# Patient Record
Sex: Male | Born: 1955 | Race: White | Hispanic: No | Marital: Married | Smoking: Former smoker
Health system: Southern US, Community
[De-identification: ages and names within clinical notes are randomized; demographics above are authoritative.]

## PROBLEM LIST (undated history)

## (undated) DIAGNOSIS — T7840XA Allergy, unspecified, initial encounter: Secondary | ICD-10-CM

## (undated) DIAGNOSIS — I4510 Unspecified right bundle-branch block: Secondary | ICD-10-CM

## (undated) DIAGNOSIS — S8252XD Displaced fracture of medial malleolus of left tibia, subsequent encounter for closed fracture with routine healing: Secondary | ICD-10-CM

## (undated) DIAGNOSIS — R7611 Nonspecific reaction to tuberculin skin test without active tuberculosis: Secondary | ICD-10-CM

## (undated) HISTORY — DX: Unspecified right bundle-branch block: I45.10

## (undated) HISTORY — PX: VASECTOMY: SHX75

## (undated) HISTORY — PX: TONSILLECTOMY: SUR1361

## (undated) HISTORY — DX: Nonspecific reaction to tuberculin skin test without active tuberculosis: R76.11

## (undated) HISTORY — DX: Allergy, unspecified, initial encounter: T78.40XA

## (undated) HISTORY — PX: LIPOMA EXCISION: SHX5283

## (undated) HISTORY — PX: COLON SURGERY: SHX602

---

## 2002-01-30 ENCOUNTER — Encounter: Payer: Self-pay | Admitting: Infectious Diseases

## 2002-01-30 ENCOUNTER — Ambulatory Visit (HOSPITAL_COMMUNITY): Admission: RE | Admit: 2002-01-30 | Discharge: 2002-01-30 | Payer: Self-pay | Admitting: Infectious Diseases

## 2004-05-26 ENCOUNTER — Ambulatory Visit: Payer: Self-pay | Admitting: Family Medicine

## 2004-06-21 ENCOUNTER — Emergency Department (HOSPITAL_COMMUNITY): Admission: EM | Admit: 2004-06-21 | Discharge: 2004-06-21 | Payer: Self-pay | Admitting: Family Medicine

## 2005-07-12 ENCOUNTER — Ambulatory Visit: Payer: Self-pay | Admitting: Internal Medicine

## 2006-09-04 ENCOUNTER — Ambulatory Visit: Payer: Self-pay | Admitting: Internal Medicine

## 2006-09-11 LAB — CONVERTED CEMR LAB
ALT: 16 units/L (ref 0–53)
AST: 19 units/L (ref 0–37)
Albumin: 4 g/dL (ref 3.5–5.2)
Alkaline Phosphatase: 67 units/L (ref 39–117)
BUN: 16 mg/dL (ref 6–23)
Basophils Absolute: 0 10*3/uL (ref 0.0–0.1)
Basophils Relative: 0.5 % (ref 0.0–1.0)
Bilirubin, Direct: 0.1 mg/dL (ref 0.0–0.3)
CO2: 29 meq/L (ref 19–32)
Calcium: 9.4 mg/dL (ref 8.4–10.5)
Chloride: 111 meq/L (ref 96–112)
Cholesterol: 169 mg/dL (ref 0–200)
Creatinine, Ser: 0.9 mg/dL (ref 0.4–1.5)
Eosinophils Absolute: 0.1 10*3/uL (ref 0.0–0.6)
Eosinophils Relative: 2.9 % (ref 0.0–5.0)
GFR calc Af Amer: 115 mL/min
GFR calc non Af Amer: 95 mL/min
Glucose, Bld: 85 mg/dL (ref 70–99)
HCT: 45.7 % (ref 39.0–52.0)
HDL: 42.9 mg/dL (ref >39.0)
Hemoglobin: 15.1 g/dL (ref 13.0–17.0)
LDL Cholesterol: 120 mg/dL — ABNORMAL HIGH (ref 0–99)
Lymphocytes Relative: 27 % (ref 12.0–46.0)
MCHC: 33 g/dL (ref 30.0–36.0)
MCV: 87.1 fL (ref 78.0–100.0)
Monocytes Absolute: 0.5 10*3/uL (ref 0.2–0.7)
Monocytes Relative: 11.5 % — ABNORMAL HIGH (ref 3.0–11.0)
Neutro Abs: 2.8 10*3/uL (ref 1.4–7.7)
Neutrophils Relative %: 58.1 % (ref 43.0–77.0)
PSA: 1.01 ng/mL (ref 0.10–4.00)
Platelets: 188 10*3/uL (ref 150–400)
Potassium: 4.4 meq/L (ref 3.5–5.1)
RBC: 5.24 M/uL (ref 4.22–5.81)
RDW: 13.4 % (ref 11.5–14.6)
Sodium: 144 meq/L (ref 135–145)
Total Bilirubin: 0.7 mg/dL (ref 0.3–1.2)
Total CHOL/HDL Ratio: 3.9
Total Protein: 6.6 g/dL (ref 6.0–8.3)
Triglycerides: 30 mg/dL (ref 0–149)
VLDL: 6 mg/dL (ref 0–40)
WBC: 4.7 10*3/uL (ref 4.5–10.5)

## 2007-09-13 ENCOUNTER — Ambulatory Visit: Payer: Self-pay | Admitting: Internal Medicine

## 2007-10-15 ENCOUNTER — Emergency Department (HOSPITAL_BASED_OUTPATIENT_CLINIC_OR_DEPARTMENT_OTHER): Admission: EM | Admit: 2007-10-15 | Discharge: 2007-10-15 | Payer: Self-pay | Admitting: Emergency Medicine

## 2007-11-20 ENCOUNTER — Encounter (INDEPENDENT_AMBULATORY_CARE_PROVIDER_SITE_OTHER): Payer: Self-pay | Admitting: *Deleted

## 2007-11-20 ENCOUNTER — Ambulatory Visit: Payer: Self-pay | Admitting: Internal Medicine

## 2007-11-20 LAB — CONVERTED CEMR LAB
OCCULT 1: NEGATIVE
OCCULT 2: NEGATIVE
OCCULT 3: NEGATIVE

## 2008-08-26 ENCOUNTER — Emergency Department (HOSPITAL_COMMUNITY): Admission: EM | Admit: 2008-08-26 | Discharge: 2008-08-26 | Payer: Self-pay | Admitting: Family Medicine

## 2008-10-08 ENCOUNTER — Ambulatory Visit: Payer: Self-pay | Admitting: Internal Medicine

## 2008-10-10 LAB — CONVERTED CEMR LAB
ALT: 15 units/L (ref 0–53)
AST: 15 units/L (ref 0–37)
BUN: 16 mg/dL (ref 6–23)
Basophils Absolute: 0 10*3/uL (ref 0.0–0.1)
Basophils Relative: 0.7 % (ref 0.0–3.0)
CO2: 31 meq/L (ref 19–32)
Calcium: 9.2 mg/dL (ref 8.4–10.5)
Chloride: 106 meq/L (ref 96–112)
Cholesterol: 162 mg/dL (ref 0–200)
Creatinine, Ser: 1 mg/dL (ref 0.4–1.5)
Eosinophils Absolute: 0.1 10*3/uL (ref 0.0–0.7)
Eosinophils Relative: 3.2 % (ref 0.0–5.0)
GFR calc non Af Amer: 83.64 mL/min (ref >60)
Glucose, Bld: 92 mg/dL (ref 70–99)
HCT: 41.9 % (ref 39.0–52.0)
HDL: 39.1 mg/dL (ref >39.00)
Hemoglobin: 14.5 g/dL (ref 13.0–17.0)
LDL Cholesterol: 115 mg/dL — ABNORMAL HIGH (ref 0–99)
Lymphocytes Relative: 24.1 % (ref 12.0–46.0)
Lymphs Abs: 1.1 10*3/uL (ref 0.7–4.0)
MCHC: 34.6 g/dL (ref 30.0–36.0)
MCV: 87 fL (ref 78.0–100.0)
Monocytes Absolute: 0.5 10*3/uL (ref 0.1–1.0)
Monocytes Relative: 11.3 % (ref 3.0–12.0)
Neutro Abs: 2.8 10*3/uL (ref 1.4–7.7)
Neutrophils Relative %: 60.7 % (ref 43.0–77.0)
PSA: 0.75 ng/mL (ref 0.10–4.00)
Platelets: 177 10*3/uL (ref 150.0–400.0)
Potassium: 4.2 meq/L (ref 3.5–5.1)
RBC: 4.81 M/uL (ref 4.22–5.81)
RDW: 13.1 % (ref 11.5–14.6)
Sodium: 143 meq/L (ref 135–145)
Total CHOL/HDL Ratio: 4
Triglycerides: 42 mg/dL (ref 0.0–149.0)
VLDL: 8.4 mg/dL (ref 0.0–40.0)
WBC: 4.5 10*3/uL (ref 4.5–10.5)

## 2008-10-13 ENCOUNTER — Ambulatory Visit: Payer: Self-pay | Admitting: Internal Medicine

## 2008-10-13 ENCOUNTER — Encounter (INDEPENDENT_AMBULATORY_CARE_PROVIDER_SITE_OTHER): Payer: Self-pay | Admitting: *Deleted

## 2008-10-16 LAB — CONVERTED CEMR LAB: Fecal Occult Bld: NEGATIVE

## 2009-05-12 ENCOUNTER — Ambulatory Visit: Payer: Self-pay | Admitting: Internal Medicine

## 2009-05-12 DIAGNOSIS — M549 Dorsalgia, unspecified: Secondary | ICD-10-CM | POA: Insufficient documentation

## 2009-05-13 ENCOUNTER — Emergency Department (HOSPITAL_BASED_OUTPATIENT_CLINIC_OR_DEPARTMENT_OTHER): Admission: EM | Admit: 2009-05-13 | Discharge: 2009-05-13 | Payer: Self-pay | Admitting: Emergency Medicine

## 2009-05-15 ENCOUNTER — Ambulatory Visit: Payer: Self-pay | Admitting: Internal Medicine

## 2009-05-26 ENCOUNTER — Ambulatory Visit (HOSPITAL_COMMUNITY): Admission: RE | Admit: 2009-05-26 | Discharge: 2009-05-26 | Payer: Self-pay | Admitting: Specialist

## 2009-05-27 ENCOUNTER — Ambulatory Visit: Payer: Self-pay | Admitting: Internal Medicine

## 2009-05-27 LAB — CONVERTED CEMR LAB
Bilirubin Urine: NEGATIVE
Blood in Urine, dipstick: NEGATIVE
Glucose, Urine, Semiquant: NEGATIVE
Ketones, urine, test strip: NEGATIVE
Nitrite: NEGATIVE
Specific Gravity, Urine: 1.015
Urobilinogen, UA: 0.2
WBC Urine, dipstick: NEGATIVE
pH: 6

## 2009-11-12 ENCOUNTER — Ambulatory Visit: Payer: Self-pay | Admitting: Internal Medicine

## 2009-11-13 ENCOUNTER — Encounter (INDEPENDENT_AMBULATORY_CARE_PROVIDER_SITE_OTHER): Payer: Self-pay | Admitting: *Deleted

## 2009-11-13 LAB — CONVERTED CEMR LAB
ALT: 19 units/L (ref 0–53)
AST: 19 units/L (ref 0–37)
BUN: 17 mg/dL (ref 6–23)
Basophils Absolute: 0 10*3/uL (ref 0.0–0.1)
Basophils Relative: 0.6 % (ref 0.0–3.0)
CO2: 28 meq/L (ref 19–32)
Calcium: 9.2 mg/dL (ref 8.4–10.5)
Chloride: 104 meq/L (ref 96–112)
Cholesterol: 173 mg/dL (ref 0–200)
Creatinine, Ser: 1 mg/dL (ref 0.4–1.5)
Eosinophils Absolute: 0.2 10*3/uL (ref 0.0–0.7)
Eosinophils Relative: 2.5 % (ref 0.0–5.0)
GFR calc non Af Amer: 87.3 mL/min (ref >60)
Glucose, Bld: 85 mg/dL (ref 70–99)
HCT: 41.6 % (ref 39.0–52.0)
HDL: 40.6 mg/dL (ref >39.00)
Hemoglobin: 14.4 g/dL (ref 13.0–17.0)
LDL Cholesterol: 126 mg/dL — ABNORMAL HIGH (ref 0–99)
Lymphocytes Relative: 21.1 % (ref 12.0–46.0)
Lymphs Abs: 1.4 10*3/uL (ref 0.7–4.0)
MCHC: 34.5 g/dL (ref 30.0–36.0)
MCV: 88.7 fL (ref 78.0–100.0)
Monocytes Absolute: 0.7 10*3/uL (ref 0.1–1.0)
Monocytes Relative: 10.6 % (ref 3.0–12.0)
Neutro Abs: 4.2 10*3/uL (ref 1.4–7.7)
Neutrophils Relative %: 65.2 % (ref 43.0–77.0)
PSA: 0.76 ng/mL (ref 0.10–4.00)
Platelets: 187 10*3/uL (ref 150.0–400.0)
Potassium: 4.2 meq/L (ref 3.5–5.1)
RBC: 4.69 M/uL (ref 4.22–5.81)
RDW: 14.5 % (ref 11.5–14.6)
Sodium: 141 meq/L (ref 135–145)
TSH: 1.22 microintl units/mL (ref 0.35–5.50)
Total CHOL/HDL Ratio: 4
Triglycerides: 34 mg/dL (ref 0.0–149.0)
VLDL: 6.8 mg/dL (ref 0.0–40.0)
WBC: 6.5 10*3/uL (ref 4.5–10.5)

## 2009-11-16 ENCOUNTER — Ambulatory Visit: Payer: Self-pay | Admitting: Internal Medicine

## 2009-11-19 LAB — CONVERTED CEMR LAB: Fecal Occult Bld: NEGATIVE

## 2010-04-04 LAB — CONVERTED CEMR LAB
ALT: 21 units/L (ref 0–53)
AST: 19 units/L (ref 0–37)
BUN: 17 mg/dL (ref 6–23)
Basophils Absolute: 0 10*3/uL (ref 0.0–0.1)
Basophils Relative: 0.3 % (ref 0.0–1.0)
CO2: 29 meq/L (ref 19–32)
Calcium: 9.3 mg/dL (ref 8.4–10.5)
Chloride: 106 meq/L (ref 96–112)
Cholesterol: 145 mg/dL (ref 0–200)
Creatinine, Ser: 0.9 mg/dL (ref 0.4–1.5)
Eosinophils Absolute: 0.1 10*3/uL (ref 0.0–0.7)
Eosinophils Relative: 2.8 % (ref 0.0–5.0)
GFR calc Af Amer: 115 mL/min
GFR calc non Af Amer: 95 mL/min
Glucose, Bld: 96 mg/dL (ref 70–99)
HCT: 42.8 % (ref 39.0–52.0)
HDL: 34.4 mg/dL — ABNORMAL LOW (ref >39.0)
Hemoglobin: 14.6 g/dL (ref 13.0–17.0)
LDL Cholesterol: 102 mg/dL — ABNORMAL HIGH (ref 0–99)
Lymphocytes Relative: 28.4 % (ref 12.0–46.0)
MCHC: 34.1 g/dL (ref 30.0–36.0)
MCV: 88.3 fL (ref 78.0–100.0)
Monocytes Absolute: 0.5 10*3/uL (ref 0.1–1.0)
Monocytes Relative: 11.2 % (ref 3.0–12.0)
Neutro Abs: 2.6 10*3/uL (ref 1.4–7.7)
Neutrophils Relative %: 57.3 % (ref 43.0–77.0)
PSA: 1.03 ng/mL (ref 0.10–4.00)
Platelets: 191 10*3/uL (ref 150–400)
Potassium: 4.4 meq/L (ref 3.5–5.1)
RBC: 4.85 M/uL (ref 4.22–5.81)
RDW: 13.2 % (ref 11.5–14.6)
Sodium: 141 meq/L (ref 135–145)
TSH: 1.1 microintl units/mL (ref 0.35–5.50)
Total CHOL/HDL Ratio: 4.2
Triglycerides: 45 mg/dL (ref 0–149)
VLDL: 9 mg/dL (ref 0–40)
WBC: 4.5 10*3/uL (ref 4.5–10.5)

## 2010-04-06 NOTE — Assessment & Plan Note (Signed)
Summary: NOT BETTER, WENT TO ER ON WED MORNING///SPH   Vital Signs:  Patient profile:   55 year old male Weight:      167 pounds Pulse rate:   70 / minute BP sitting:   100 / 70  (left arm)  Vitals Entered By: Doristine Devoid (May 15, 2009 10:41 AM) CC: f/u from er- back pain better but still some occassional discomfort wants to know what next step may be   History of Present Illness: was seen 05/12/09 with back pain without radiation the pain got a lot worse, it radiated to the left thigh  ' like a burning sensation" and   also radiated to the left genital  area  he went to the ER, received Dilaudid and Valium  ---->  got better he was prescribed Percocet and Valium todasy he took 1/2  Percocet and one Vicodin, symptoms are relatively well controlled at present    Allergies: No Known Drug Allergies  Past History:  Past Medical History: Reviewed history from 10/08/2008 and no changes required. 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 Tonsillectomy Vasectomy lipoma excision, right leg  Review of Systems       denies fever  has one blister at the left  hip, he thinks it is related to a heating pad that he used he had a bowel movement today, normal he had some difficulty urinating yesterday but he is doing okay today  Physical Exam  General:  alert and well-developed.   Abdomen:  soft, non-tender, no distention, and no masses.   Genitalia:  circumcised, no hydrocele, no varicocele, no scrotal masses, no cutaneous lesions, and no urethral discharge.   Extremities:  no edema Neurologic:  lower extremity strength on reflexes are symmetric. Straight leg raise test negative   Skin:  at the left hip  has single 1 cm blister with some erythema around it   Impression & Recommendations:  Problem # 1:  BACK PAIN (ICD-724.5) back pain got worse and  radiated somehow to the left hip he has a  single blister in the left hip, does not look like herpes but I can't be completely sure Plan: Continue with conservative treatment -- Vicodin as needed, thinks he doesn't need  Percocet anymore -- Valium as needed , #20 -- prednisone as prescribed  he will call me any time if the pain is worse or if he developed more blisters ( Will need  Valtrex) The following medications were removed from the medication list:    Percocet 7.5-500 Mg Tabs (Oxycodone-acetaminophen) .Marland Kitchen... Take one tablet as needed for back pain His updated medication list for this problem includes:    Ibuprofen 600 Mg Tabs (Ibuprofen) .Marland Kitchen... Take one tablet three times a day w/ meals  Complete Medication List: 1)  Calcium  2)  Fish Oil  .... 2 by mouth once daily 3)  Prednisone 10 Mg Tabs (Prednisone) .... 5 by mouth once daily x 2, 4x2,3x2,2x2,1x1 4)  Valium 5 Mg Tabs (Diazepam) .... Take one tablet every 6 hours as needed for muscle spasms 5)  Ibuprofen 600 Mg Tabs (Ibuprofen) .... Take one tablet three times a day w/ meals  Patient Instructions: 1)  take Vicodin, prednisone, Valium   as prescribed 2)  call if not better 3)  call any time if the symptoms are worse or  you  develop a rash Prescriptions: VALIUM 5 MG TABS (DIAZEPAM) take one tablet every  6 hours as needed for muscle spasms  #20 x 0   Entered and Authorized by:   Nolon Rod. Adriona Kaney MD   Signed by:   Nolon Rod. Adrine Hayworth MD on 05/15/2009   Method used:   Print then Give to Patient   RxID:   [id number]

## 2010-04-06 NOTE — Letter (Signed)
Summary: [facility]: Immunoassay Fecal Occult Blood (iFOB) Order Form  [facility]  [address] [place], [place] [address]   Phone: [phone]  Fax: [phone]      [facility]: Immunoassay Fecal Occult Blood (iFOB) Order Form   November 13, 2009 MRN: [id number]   CAMARA ROSANDER 1955-12-24   Physicican Name:_____jose paz,md____________________  Diagnosis Code:______v76.51____________________      Army Fossa CMA

## 2010-04-06 NOTE — Assessment & Plan Note (Signed)
Summary: lower back pain into left hip//lch   Vital Signs:  Patient profile:   55 year old male Height:      68 inches Weight:      160 pounds Pulse rate:   60 / minute BP sitting:   140 / 82  Vitals Entered By: Shary Decamp (May 12, 2009 3:21 PM) CC: low back pain x 3/5 - started hurting while playing hockey, pain is on left side -- having some numbness & tingling in his foot, no relief with ibuprofen   History of Present Illness: back pain for 3 days started during the weekend after he played hockey ( which he plays often) pain is located in the left lower back, normal ideation, ibuprofen helps. Intensity of the pain improved yesterday but today is quite severe, he is unable to work  Allergies: No Known Drug Allergies  Social History: Reviewed history from 10/08/2008 and no changes required. Married 3 children. Patient is from [place], [place] from [place]. 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 Former Smoker (only smoked for a couple of year before the age of 74) Alcohol use-yes (1 drink a week) Drug use-no Regular exercise-yes (plays hockey, runs)  Review of Systems       denies fever, rash no injury no bladder or bowel incontinence does  have mild tingling at the inner aspect of the left calf  and L foot no abdominal  pain, dysuria or hematuria  Physical Exam  General:  alert and well-developed.   Abdomen:  soft and non-tender.   Msk:  slightly tender over the left sacroiliac area he does have an antalgic posture  Extremities:  no edema Neurologic:  lower extremity strength on reflexes are symmetric. Straight leg raise test negative pinprick examination normal   Impression & Recommendations:  Problem # 1:  BACK PAIN (ICD-724.5) Assessment New acute lumbalgia , likely musculoskeletal his only neurological symptom is some tingling in the inner aspect of his foot. Plan: See instructions will be able to   be  off work for 5 days (had some time off schedule) side effects of medicines discuss  His updated medication list for this problem includes:    Vicodin 5-500 Mg Tabs (Hydrocodone-acetaminophen) ..... One or two by mouth every 6 hours as needed for pain  Complete Medication List: 1)  Calcium  2)  Fish Oil  .... 2 by mouth once daily 3)  Prednisone 10 Mg Tabs (Prednisone) .... 5 by mouth once daily x 2, 4x2,3x2,2x2,1x1 4)  Vicodin 5-500 Mg Tabs (Hydrocodone-acetaminophen) .... One or two by mouth every 6 hours as needed for pain  Patient Instructions: 1)  rest, warm compress 2)  prednisone as prescribed 3)  for pain take Tylenol, if the pain is severe take Vicodin 4)  call me if you are not better in one week Prescriptions: VICODIN 5-500 MG TABS (HYDROCODONE-ACETAMINOPHEN) one or two by mouth every 6 hours as needed for pain  #30 x 0   Entered and Authorized by:   Nolon Rod. Ingeborg Fite MD   Signed by:   Nolon Rod. Amber Williard MD on 05/12/2009   Method used:   Print then Give to Patient   RxID:   [id number] PREDNISONE 10 MG TABS (PREDNISONE) 5 by mouth once daily x 2, 4x2,3x2,2x2,1x1  #30 x 0   Entered and Authorized by:   Nolon Rod. Shayna Eblen MD   Signed by:   Nolon Rod. Koen Antilla MD on 05/12/2009  Method used:   Print then Give to Patient   RxID:   [id number]

## 2010-04-06 NOTE — Assessment & Plan Note (Signed)
Summary: cpx & lab/cbs   Vital Signs:  Patient profile:   55 year old male Height:      68 inches Weight:      160 pounds BMI:     24.42 Pulse rate:   56 / minute Pulse rhythm:   regular BP sitting:   110 / 78  (left arm) Cuff size:   large  Vitals Entered By: Shary Decamp (October 08, 2008 8:21 AM) CC: CPX Comments  - fasting  - no concerns Shary Decamp  October 08, 2008 8:25 AM    History of Present Illness: CPX   - fasting  - no concerns  Preventive Screening-Counseling & Management  Alcohol-Tobacco     Smoking Status: quit  Caffeine-Diet-Exercise     Does Patient Exercise: yes      Drug Use:  no.    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 Tonsillectomy Vasectomy lipoma excision, right leg  Family History: Reviewed history from 09/13/2007 and no changes required. no history of prostate or colon cancer. No diabetes. Grandmother had CHF. HTN - no CAD - PGM (pacemaker) stroke - no  Social History: Reviewed history from 09/13/2007 and no changes required. Married 3 children. Patient is from [place], [place] from [place]. 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 Former Smoker (only smoked for a couple of year before the age of 77) Alcohol use-yes (1 drink a week) Drug use-no Regular exercise-yes (plays hockey, runs)  Review of Systems General:  Denies fatigue and fever; some wt loss , thinks related to increase running  had L plantar fasceitis type of symptoms (now better). CV:  Denies chest pain or discomfort and swelling of feet. Resp:  Denies cough and shortness of breath. GI:  Denies abdominal pain, bloody stools, diarrhea, and nausea. GU:  Denies hematuria, urinary frequency, and urinary hesitancy. Psych:  Denies anxiety and  depression.  Physical Exam  General:  alert, well-developed, and well-nourished.   Neck:  no thyromegaly.   Lungs:  normal respiratory effort, no intercostal retractions, no accessory muscle use, and normal breath sounds.   Heart:  normal rate, regular rhythm, no murmur, and no gallop.   Abdomen:  soft, non-tender, normal bowel sounds, no distention, no masses, and no guarding.   Rectal:  No external abnormalities noted. Normal sphincter tone. No rectal masses or tenderness. Hemocult neg  Prostate:  Prostate gland firm and smooth, no enlargement, nodularity, tenderness, mass, asymmetry or induration. Extremities:  no pretibial edema bilaterally  Psych:  Cognition and judgment appear intact. Alert and cooperative with normal attention span and concentration.    Impression & Recommendations:  Problem # 1:  HEALTH MAINTENANCE EXAM (ICD-V70.0) TD 2007 Colonoscopy Vs. Hemocult cards:  differences discussed .  Provided iFOB but if  he decides to have a  colonoscopy he will call cont healthy life style Td 2007    Orders: Venipuncture ([address]) TLB-BMP (Basic Metabolic Panel-BMET) (80048-METABOL) TLB-CBC Platelet - w/Differential (85025-CBCD) TLB-ALT (SGPT) (84460-ALT) TLB-AST (SGOT) (84450-SGOT) TLB-PSA (Prostate Specific Antigen) (84153-PSA) TLB-Lipid Panel (80061-LIPID)  Complete Medication List: 1)  Calcium  2)  Fish Oil  .... 2 by mouth once daily  Patient Instructions: 1)  Please schedule a follow-up appointment in 1 year.    Preventive Care Screening  Prior Values:    PSA:  1.03 (09/13/2007)    Last Tetanus Booster:  Historical (07/12/2005)    Immunization History:  Hepatitis A History:    Hepatitis A # 3:  @ [facility] (05/05/2008)

## 2010-04-06 NOTE — Assessment & Plan Note (Signed)
Summary: cpx//tl   Vital Signs:  Patient Profile:   55 Years Old Male Height:     68 inches Weight:      167.6 pounds BMI:     25.58 Pulse rate:   58 / minute BP sitting:   118 / 80  Vitals Entered By: Shary Decamp (September 13, 2007 8:43 AM)                 Chief Complaint:  cpx - fasting.  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       No diabetes.       Grandmother had CHF.       HTN - no       CAD - PGM (pacemaker)       stroke - no  Social History:    Reviewed history from 09/04/2006 and no changes required:       Married       a children.       Patient is from [place], [place] nort from [place].       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       Plays hockey   Risk Factors:  Tobacco use:  quit    Year quit:  30 years ago Passive smoke exposure:  no Drug use:  no Alcohol use:  yes Exercise:  yes    Times per week:  5   Review of Systems       very active diet-- healthy  CV      Denies chest pain or discomfort and swelling of feet.  Resp      Denies cough and shortness of breath.  GI      Denies bloody stools and diarrhea.  GU      Denies hematuria and urinary hesitancy.  Psych      Denies anxiety and depression.      no diff sleeping   Physical Exam  General:     alert, well-developed, and well-nourished.   Ears:     R ear normal and L ear normal.   Mouth:     pharynx pink and moist.   Lungs:     normal respiratory effort,  no intercostal retractions, no accessory muscle use, and normal breath sounds.   Heart:     normal rate, regular rhythm, no murmur, and no gallop.   Abdomen:     soft, non-tender, no hepatomegaly, and no splenomegaly.   Rectal:     No external abnormalities noted. Normal sphincter tone. No rectal masses or tenderness. hemocult neg Prostate:     Prostate gland firm and smooth, no enlargement, nodularity, tenderness, mass, asymmetry or induration. Extremities:     no pretibial edema bilaterally  Neurologic:     alert & oriented X3 and gait normal.  Psych:     Cognition and judgment appear intact. Alert and cooperative with normal attention span and concentration.    Impression & Recommendations:  Problem # 1:  HEALTH MAINTENANCE EXAM (ICD-V70.0) EKG-- at baseline labs Colonoscopy Vs. Hemocult cards: reviewed w/ pt. Provided hemocults in case he decide to do them, otherwise, he will call and ask for a colonoscopy  cont healthy life style Td 2007 Orders: TLB-Lipid Panel (80061-LIPID) TLB-BMP (Basic Metabolic Panel-BMET) (80048-METABOL) TLB-CBC Platelet - w/Differential (85025-CBCD) TLB-TSH (Thyroid Stimulating Hormone) (84443-TSH) TLB-ALT (SGPT) (84460-ALT) TLB-AST (SGOT) (84450-SGOT) TLB-PSA (Prostate Specific Antigen) (84153-PSA) Venipuncture ([address]) EKG w/ Interpretation (93[address])   Complete Medication List: 1)  Calcium    Patient Instructions: 1)  Please schedule a follow-up appointment in 1 year.   ]

## 2010-04-06 NOTE — Letter (Signed)
Summary: [facility]: Immunoassay Fecal Occult Blood (iFOB) Order Form  [facility]  [address] [place], [place] [address]   Phone: [phone]  Fax: [phone]      [facility]: Immunoassay Fecal Occult Blood (iFOB) Order Form   October 13, 2008 MRN: [id number]   WILIAN KWONG 1956/01/23   Physicican Name:__________paz_______________  Diagnosis Code:__________v76.51________________      Shary Decamp

## 2010-04-06 NOTE — Assessment & Plan Note (Signed)
Summary: CPX/FASTING//KN   Vital Signs:  Patient profile:   55 year old male Height:      68 inches Weight:      161 pounds BMI:     24.57 Pulse rate:   56 / minute BP sitting:   102 / 70  (left arm)  Vitals Entered By: Doristine Devoid CMA (November 12, 2009 8:23 AM) CC: CPX AND LABS    History of Present Illness: CPX low back pain eventually subsided, he is now back doing his normal activities without problems. He saw orthopedic surgery but elected conservative treatment  Current Medications (verified): 1)  Calcium 2)  Fish Oil .... 2 By Mouth Once Daily  Allergies (verified): No Known Drug Allergies  Past History:  Past Medical History: TB SKIN TEST, POSITIVE, HX OF   LBP x months , eventually self-resolved  (2011)  Past Surgical History: Reviewed history from 09/04/2006 and no changes required. Tonsillectomy Vasectomy lipoma excision, right leg  Family History: no history of prostate or colon cancer glioblastoma-- B dx age 4  No diabetes  Grandmother had CHF. HTN - no CAD - PGM (pacemaker) stroke - no  Social History: Reviewed history from 10/08/2008 and no changes required. Married 3 children. Patient is from [place], [place] from [place]. 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 Former Smoker (only smoked for a couple of year before the age of 67) Alcohol use-yes (1 drink a week) Drug use-no Regular exercise-yes (plays hockey, runs)  Review of Systems General:  Denies fever and weight loss. CV:  Denies chest pain or discomfort, palpitations, and swelling of feet. Resp:  Denies cough and wheezing. GI:  Denies bloody stools, diarrhea, nausea, and vomiting. GU:  Denies dysuria, hematuria, urinary frequency, and urinary hesitancy. Psych:  Denies anxiety and depression.  Physical Exam  General:  alert, well-developed, and well-nourished.   Neck:  no masses, no thyromegaly, and normal carotid upstroke.    Lungs:  normal respiratory effort, no intercostal retractions, no accessory muscle use, and normal breath sounds.   Heart:  normal rate, regular rhythm, no murmur, and no gallop.   Abdomen:  soft, non-tender, no distention, no masses, no guarding, and no rigidity.   Rectal:  No external abnormalities noted. Normal sphincter tone. No rectal masses or tenderness. Hemoccult negative Prostate:  Prostate gland firm and smooth, no enlargement, nodularity, tenderness, mass, asymmetry or induration. Extremities:  no pretibial edema bilaterally  Neurologic:  alert & oriented X3, strength normal in all extremities, and gait normal.   Psych:  Oriented X3, memory intact for recent and remote, normally interactive, good eye contact, not anxious appearing, and not depressed appearing.     Impression & Recommendations:  Problem # 1:  HEALTH MAINTENANCE EXAM (ICD-V70.0) TD 2007 will get flu shot at work   Colonoscopy Vs. Hemocult cards:  differences discussed .  Provided iFOB but if  he decides to have a  colonoscopy he will call cont healthy life style   continue healthy life style   Orders: Venipuncture ([address]) TLB-BMP (Basic Metabolic Panel-BMET) (80048-METABOL) TLB-CBC Platelet - w/Differential (85025-CBCD) TLB-Lipid Panel (80061-LIPID) TLB-TSH (Thyroid Stimulating Hormone) (84443-TSH) TLB-PSA (Prostate Specific Antigen) (84153-PSA) TLB-ALT (SGPT) (84460-ALT) TLB-AST (SGOT) (84450-SGOT) Specimen Handling ([address])  Complete Medication List: 1)  Calcium  2)  Fish Oil  .... 2 by mouth once daily  Patient Instructions: 1)  Please schedule a follow-up appointment in 1 year.

## 2010-04-06 NOTE — Letter (Signed)
Summary: Results Follow up Letter  [facility]  [address] [place], [place] [address]   Phone: [phone]  Fax: [phone]    11/20/2007 MRN: [id number]  [facility] [address] [place], [place]  [address]  Dear Jorge Humphrey,  The following are the results of your recent test(s):  Test         Result    Pap Smear:        Normal _____  Not Normal _____ Comments: ______________________________________________________ Cholesterol: LDL(Bad cholesterol):         Your goal is less than:         HDL (Good cholesterol):       Your goal is more than: Comments:  ______________________________________________________ Mammogram:        Normal _____  Not Normal _____ Comments:  ___________________________________________________________________ Hemoccult:        Normal ___x__  Not normal _______ Comments:    _________Your stool cards were negative for blood __________________________________________________________ Other Tests:    We routinely do not discuss normal results over the telephone.  If you desire a copy of the results, or you have any questions about this information we can discuss them at your next office visit.   Sincerely,

## 2010-04-06 NOTE — Assessment & Plan Note (Signed)
Summary: CPX & LAB/CBS   Vital Signs:  Patient Profile:   55 Years Old Male Height:     68 inches Weight:      167.4 pounds Pulse rate:   60 / minute Pulse rhythm:   regular BP sitting:   120 / 80  (left arm) Cuff size:   regular  Vitals Entered By: Shary Decamp (September 04, 2006 8:30 AM)               Chief Complaint:  cpx/fasting.  History of Present Illness: CPX  Current Allergies: No known allergies   Past Surgical History:    Tonsillectomy    Vasectomy    lipoma excision, right leg   Family History:    no history of prostate or colon cancer.    No diabetes.    Grandmother had CHF.  Social History:    Married    a children.    Patient is from [place], [place] from [place].    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    Plays hockey    Review of Systems       the patient remains active  CV      Denies chest pain or discomfort.  Resp      Denies shortness of breath.  GI      Denies bloody stools, diarrhea, nausea, and vomiting.  GU      no dysuria hematuria or difficulty urinating   Physical Exam  General:     alert, well-developed, and well-nourished.   Neck:     no thyromegaly.   Lungs:     normal respiratory effort, no intercostal retractions, no accessory muscle use, and normal breath sounds.   Heart:     normal rate, regular rhythm, no murmur, and no gallop.   Abdomen:     soft, non-tender, no hepatomegaly, and no splenomegaly.   Rectal:     no external abnormalities, no hemorrhoids, normal sphincter tone, and no masses.   Prostate:     Prostate gland firm and smooth, no enlargement, nodularity, tenderness, mass, asymmetry or induration. Extremities:     no edema Neurologic:     alert & oriented X3, strength normal in all extremities, gait normal, and DTRs symmetrical and normal.      Impression & Recommendations:  Problem # 1:  HEALTH MAINTENANCE EXAM (ICD-V70.0) EKG a year  ago was normal prostate cancer screening will talk about colon cancer screening next year. Continue with his healthy lifestyle   Orders: TLB-BMP (Basic Metabolic Panel-BMET) (80048-METABOL) TLB-Hepatic/Liver Function Pnl (80076-HEPATIC) TLB-CBC Platelet - w/Differential (85025-CBCD) TLB-Lipid Panel (80061-LIPID) Venipuncture ([address]) TLB-PSA (Prostate Specific Antigen) (84153-PSA)   Problem # 2:  patient would like a prescription for prednisone. states he is going to [place] and in the past she always have a "sun reaction" that has been successfully treated with prednisone. He was reminded about importance of avoiding excesive  sun tanning and use sun  blockers. I think is reasonable to prescribe prednisone to this non-diabetic patient who is a Engineer, civil (consulting).  Medications Added to Medication List This Visit: 1)  Prednisone 10 Mg Tabs (Prednisone) .... 3 p.o. daily x 2 , two p.o. daily x 2, one p.o. daily x 2. prn     Prescriptions: PREDNISONE 10 MG  TABS (PREDNISONE) 3 p.o. daily x 2 , two p.o. daily x 2, one p.o. daily x 2. prn  #12 x 0   Entered and Authorized  by:   Nolon Rod Paz MD   Signed by:   Nolon Rod. Paz MD on 09/04/2006   Method used:   Print then Give to Patient   RxID:   [id number]    Tetanus/Td Immunization History:    Tetanus/Td # 1:  Historical (07/12/2005)

## 2010-04-06 NOTE — Assessment & Plan Note (Signed)
Summary: F/U BACK PAIN/RH........Marland Kitchen   Vital Signs:  Patient profile:   55 year old male Height:      68 inches Weight:      160.2 pounds Temp:     98.5 degrees F BP sitting:   140 / 80  Vitals Entered By: Shary Decamp (May 27, 2009 12:57 PM) CC: back pain Comments  - felt better when he was on prednisone  - increased pain when standing for long period  - pain, numbness in (left) groin, hip, buttocks  - pt states he has felt a lump LLQ Shary Decamp  May 27, 2009 12:58 PM    History of Present Illness: overall LBP improved although was better while on prednisone has been able to ryde his bike and work in the yard w/o problems does have a paresthesia at the proximal-ant. L thigh and L suprapubic area   Current Medications (verified): 1)  Calcium 2)  Fish Oil .... 2 By Mouth Once Daily 3)  Ibuprofen 600 Mg Tabs (Ibuprofen) .... Take One Tablet Three Times A Day W/ Meals  Allergies (verified): No Known Drug Allergies  Past History:  Past Medical History: Reviewed history from 10/08/2008 and no changes required. 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 Tonsillectomy Vasectomy lipoma excision, right leg  Review of Systems       BMs ok no dysuria or hematuria had a blister at the L hip: resolved , no other blisters noted   Physical Exam  General:  alert and well-developed.   Abdomen:  soft, non-tender, no distention, no masses, no guarding, no rigidity, and no inguinal hernia.   Extremities:  no pretibial edema bilaterally  Neurologic:  lower extremity strength on reflexes are symmetric. Straight leg raise test negative     Impression & Recommendations:  Problem # 1:  BACK PAIN (ICD-724.5)  LBP better but persisten, does have paresthesias in the L leg-L suprapubic area options: wait vs ortho referal /?MRI patient likes further eval ----> ortho referal (likes Dr  August Saucer)  His updated medication list for this problem includes:    Ibuprofen 600 Mg Tabs (Ibuprofen) .Marland Kitchen... Take one tablet three times a day w/ meals  Orders: Orthopedic Referral (Ortho)  Complete Medication List: 1)  Calcium  2)  Fish Oil  .... 2 by mouth once daily 3)  Ibuprofen 600 Mg Tabs (Ibuprofen) .... Take one tablet three times a day w/ meals  Laboratory Results   Urine Tests    Routine Urinalysis   Color: yellow Appearance: Clear Glucose: negative   (Normal Range: Negative) Bilirubin: negative   (Normal Range: Negative) Ketone: negative   (Normal Range: Negative) Spec. Gravity: 1.015   (Normal Range: 1.003-1.035) Blood: negative   (Normal Range: Negative) pH: 6.0   (Normal Range: 5.0-8.0) Protein: trace   (Normal Range: Negative) Urobilinogen: 0.2   (Normal Range: 0-1) Nitrite: negative   (Normal Range: Negative) Leukocyte Esterace: negative   (Normal Range: Negative)

## 2010-05-23 IMAGING — CR DG FINGER LITTLE 2+V*R*
3 series · 3 of 3 positions shown · non-contrast
Comparison: None

CLINICAL DATA: Crush injury to distal right fifth digit

RIGHT LITTLE FINGER 2+V

[x finger pa right]
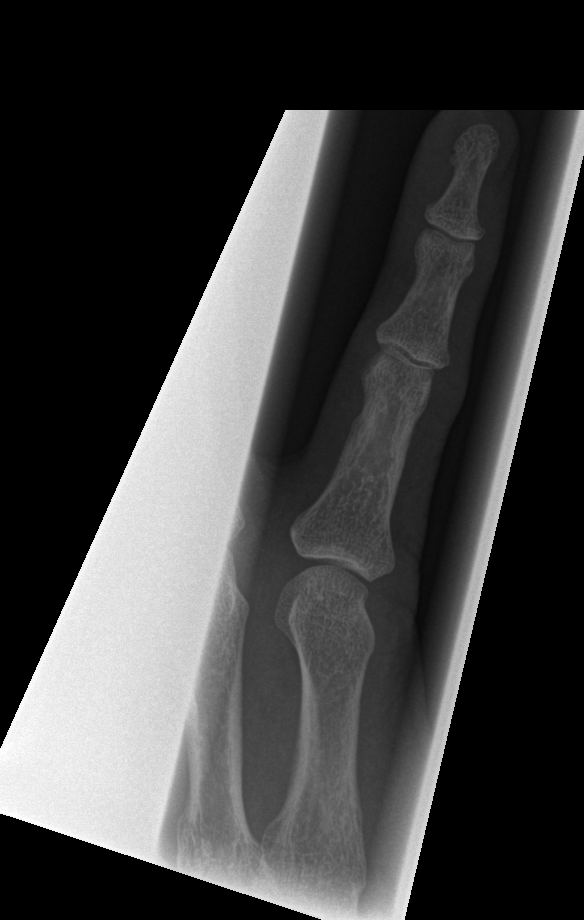

[x finger obl. right]
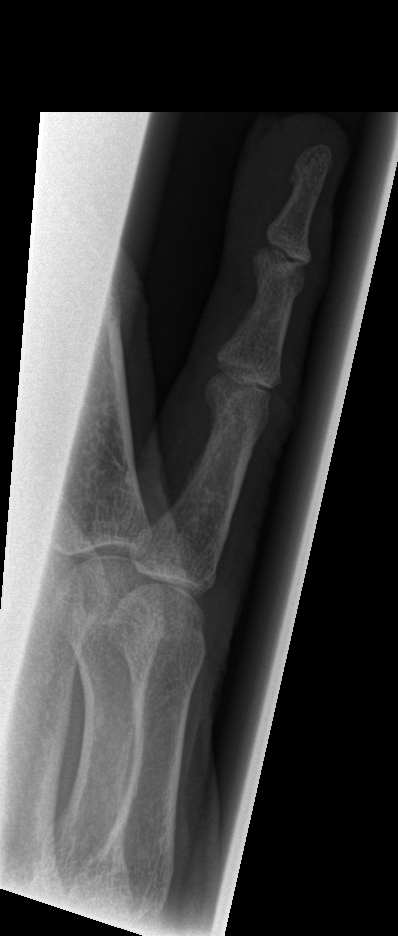

[x finger lateral right]
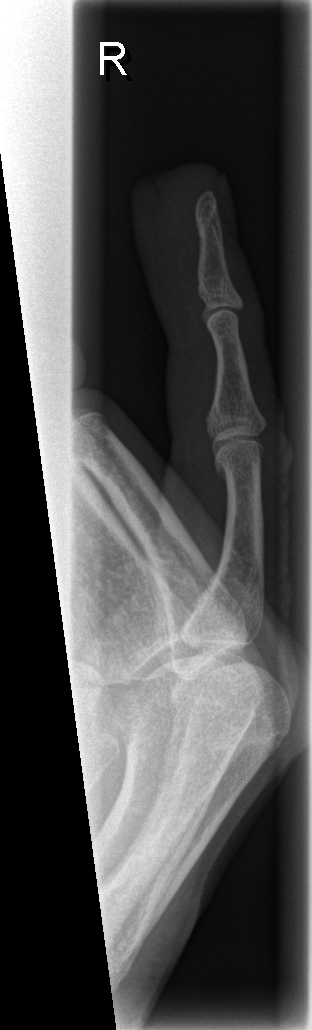

[3 of 3 positions shown; findings below may reference images not displayed]

FINDINGS: Soft tissue laceration noted over the palmar aspect of
the distal phalanx of the right fifth digit.  No underlying
fracture or dislocation.  No radiopaque foreign body.
IMPRESSION: Soft tissue laceration, distal right fifth digit palmar surface.
No underlying acute osseous finding.

## 2010-11-16 ENCOUNTER — Ambulatory Visit (INDEPENDENT_AMBULATORY_CARE_PROVIDER_SITE_OTHER): Payer: Self-pay | Admitting: Internal Medicine

## 2010-11-16 ENCOUNTER — Encounter: Payer: Self-pay | Admitting: Internal Medicine

## 2010-11-16 DIAGNOSIS — Z0000 Encounter for general adult medical examination without abnormal findings: Secondary | ICD-10-CM | POA: Insufficient documentation

## 2010-11-16 DIAGNOSIS — M549 Dorsalgia, unspecified: Secondary | ICD-10-CM

## 2010-11-16 LAB — CBC WITH DIFFERENTIAL/PLATELET
Basophils Absolute: 0 10*3/uL (ref 0.0–0.1)
Basophils Relative: 0.9 % (ref 0.0–3.0)
Eosinophils Absolute: 0.2 10*3/uL (ref 0.0–0.7)
Eosinophils Relative: 5.1 % — ABNORMAL HIGH (ref 0.0–5.0)
HCT: 41.5 % (ref 39.0–52.0)
Hemoglobin: 13.9 g/dL (ref 13.0–17.0)
Lymphocytes Relative: 26.4 % (ref 12.0–46.0)
Lymphs Abs: 1.2 10*3/uL (ref 0.7–4.0)
MCHC: 33.6 g/dL (ref 30.0–36.0)
MCV: 88.4 fl (ref 78.0–100.0)
Monocytes Absolute: 0.5 10*3/uL (ref 0.1–1.0)
Monocytes Relative: 10.3 % (ref 3.0–12.0)
Neutro Abs: 2.7 10*3/uL (ref 1.4–7.7)
Neutrophils Relative %: 57.3 % (ref 43.0–77.0)
Platelets: 175 10*3/uL (ref 150.0–400.0)
RBC: 4.69 Mil/uL (ref 4.22–5.81)
RDW: 14.5 % (ref 11.5–14.6)
WBC: 4.7 10*3/uL (ref 4.5–10.5)

## 2010-11-16 LAB — LIPID PANEL
Cholesterol: 161 mg/dL (ref 0–200)
HDL: 41.6 mg/dL (ref >39.00)
LDL Cholesterol: 112 mg/dL — ABNORMAL HIGH (ref 0–99)
Total CHOL/HDL Ratio: 4
Triglycerides: 39 mg/dL (ref 0.0–149.0)
VLDL: 7.8 mg/dL (ref 0.0–40.0)

## 2010-11-16 LAB — COMPREHENSIVE METABOLIC PANEL
ALT: 16 U/L (ref 0–53)
AST: 15 U/L (ref 0–37)
Albumin: 4.1 g/dL (ref 3.5–5.2)
Alkaline Phosphatase: 56 U/L (ref 39–117)
BUN: 18 mg/dL (ref 6–23)
CO2: 28 mEq/L (ref 19–32)
Calcium: 9.4 mg/dL (ref 8.4–10.5)
Chloride: 106 mEq/L (ref 96–112)
Creatinine, Ser: 0.9 mg/dL (ref 0.4–1.5)
GFR: 97.42 mL/min (ref >60.00)
Glucose, Bld: 103 mg/dL — ABNORMAL HIGH (ref 70–99)
Potassium: 4.8 mEq/L (ref 3.5–5.1)
Sodium: 141 mEq/L (ref 135–145)
Total Bilirubin: 0.6 mg/dL (ref 0.3–1.2)
Total Protein: 6.5 g/dL (ref 6.0–8.3)

## 2010-11-16 LAB — PSA: PSA: 0.84 ng/mL (ref 0.10–4.00)

## 2010-11-16 LAB — TSH: TSH: 1.24 u[IU]/mL (ref 0.35–5.50)

## 2010-11-16 NOTE — Progress Notes (Signed)
  Subjective:    Patient ID: Jorge Humphrey, male    DOB: August 13, 1955, 55 y.o.   MRN: [id number]  HPI CPX  Past Medical History  Diagnosis Date  . PPD positive     h/o   Past Surgical History  Procedure Date  . Tonsillectomy   . Vasectomy   . Lipoma excision     R leg     Family History: no history of prostate or colon cancer glioblastoma-- B dx age 17, passed away 06-04-10  No diabetes  Grandmother had CHF. HTN - no CAD - PGM and cousin have a pacemaker  stroke - no  Social History: Married 3 children. Patient is from [place], [place] from [place]. 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 Former Smoker (only smoked for a couple of year before the age of 41) Alcohol use-yes (1 drink a week) Drug use-no Regular exercise-yes (plays hockey, runs)  Review of Systems  Constitutional: Negative for fever and fatigue.  Respiratory: Negative for cough and shortness of breath.   Cardiovascular: Negative for chest pain and leg swelling.  Gastrointestinal: Negative for abdominal pain and blood in stool.  Genitourinary: Negative for dysuria, hematuria and difficulty urinating.  Musculoskeletal:       No further back pain, occ tingling at the leg after exercise        Objective:   Physical Exam  Constitutional: He is oriented to person, place, and time. He appears well-developed and well-nourished. No distress.  HENT:  Head: Normocephalic and atraumatic.  Eyes: Conjunctivae are normal.  Neck: No thyromegaly present.  Cardiovascular: Normal rate, regular rhythm and normal heart sounds.   No murmur heard. Pulmonary/Chest: Effort normal and breath sounds normal. No respiratory distress. He has no wheezes. He has no rales.  Abdominal: Soft. Bowel sounds are normal. He exhibits no distension. There is no tenderness. There is no rebound and no guarding.  Genitourinary: Rectum normal and prostate normal. Guaiac negative stool.    Musculoskeletal: He exhibits no edema.  Neurological: He is alert and oriented to person, place, and time.  Skin: Skin is warm and dry. He is not diaphoretic.  Psychiatric: He has a normal mood and affect. His behavior is normal. Judgment and thought content normal.          Assessment & Plan:

## 2010-11-16 NOTE — Assessment & Plan Note (Addendum)
Td 07 iFOB 11-2009, provided IFOB again, he is interested on a Cscope in the next few months, will call when ready Cont healthy life style Labs

## 2010-11-16 NOTE — Assessment & Plan Note (Signed)
Resolved, except occ LE tingling w/ running Plan: observe

## 2010-11-19 ENCOUNTER — Telehealth: Payer: Self-pay

## 2010-11-19 NOTE — Telephone Encounter (Signed)
Left message for pt to call back for lab results. Labs mailed

## 2010-11-19 NOTE — Telephone Encounter (Signed)
Message copied by Beverely Low on Fri Nov 19, 2010  8:41 AM ------      Message from: Jorge Humphrey      Created: Thu Nov 18, 2010  4:51 PM       Advise the patient:      all his labs are within normal, with very good results

## 2012-01-03 ENCOUNTER — Other Ambulatory Visit (HOSPITAL_COMMUNITY): Payer: Self-pay | Admitting: Specialist

## 2012-01-03 ENCOUNTER — Ambulatory Visit (HOSPITAL_COMMUNITY)
Admission: RE | Admit: 2012-01-03 | Discharge: 2012-01-03 | Disposition: A | Discharge status: Home / Self Care | Payer: 59 | Source: Ambulatory Visit | Attending: Specialist | Admitting: Specialist

## 2012-01-03 DIAGNOSIS — R7611 Nonspecific reaction to tuberculin skin test without active tuberculosis: Secondary | ICD-10-CM

## 2012-04-26 ENCOUNTER — Ambulatory Visit (INDEPENDENT_AMBULATORY_CARE_PROVIDER_SITE_OTHER): Payer: 59 | Admitting: Internal Medicine

## 2012-04-26 ENCOUNTER — Encounter: Payer: Self-pay | Admitting: Internal Medicine

## 2012-04-26 VITALS — BP 104/64 | HR 48 | Temp 98.3°F | Ht 68.5 in | Wt 165.0 lb

## 2012-04-26 DIAGNOSIS — J309 Allergic rhinitis, unspecified: Secondary | ICD-10-CM | POA: Insufficient documentation

## 2012-04-26 DIAGNOSIS — Z0000 Encounter for general adult medical examination without abnormal findings: Secondary | ICD-10-CM

## 2012-04-26 LAB — COMPREHENSIVE METABOLIC PANEL
ALT: 19 U/L (ref 0–53)
AST: 19 U/L (ref 0–37)
Albumin: 4.2 g/dL (ref 3.5–5.2)
Alkaline Phosphatase: 63 U/L (ref 39–117)
BUN: 18 mg/dL (ref 6–23)
CO2: 30 mEq/L (ref 19–32)
Calcium: 9.3 mg/dL (ref 8.4–10.5)
Chloride: 107 mEq/L (ref 96–112)
Creatinine, Ser: 0.9 mg/dL (ref 0.4–1.5)
GFR: 98.2 mL/min (ref >60.00)
Glucose, Bld: 91 mg/dL (ref 70–99)
Potassium: 4.3 mEq/L (ref 3.5–5.1)
Sodium: 142 mEq/L (ref 135–145)
Total Bilirubin: 1 mg/dL (ref 0.3–1.2)
Total Protein: 6.5 g/dL (ref 6.0–8.3)

## 2012-04-26 LAB — LIPID PANEL
Cholesterol: 163 mg/dL (ref 0–200)
HDL: 42.9 mg/dL (ref >39.00)
LDL Cholesterol: 114 mg/dL — ABNORMAL HIGH (ref 0–99)
Total CHOL/HDL Ratio: 4
Triglycerides: 33 mg/dL (ref 0.0–149.0)
VLDL: 6.6 mg/dL (ref 0.0–40.0)

## 2012-04-26 LAB — CBC WITH DIFFERENTIAL/PLATELET
Basophils Absolute: 0 10*3/uL (ref 0.0–0.1)
Basophils Relative: 1 % (ref 0.0–3.0)
Eosinophils Absolute: 0.2 10*3/uL (ref 0.0–0.7)
Eosinophils Relative: 3.9 % (ref 0.0–5.0)
HCT: 43.8 % (ref 39.0–52.0)
Hemoglobin: 14.5 g/dL (ref 13.0–17.0)
Lymphocytes Relative: 26.8 % (ref 12.0–46.0)
Lymphs Abs: 1.3 10*3/uL (ref 0.7–4.0)
MCHC: 33.2 g/dL (ref 30.0–36.0)
MCV: 89.2 fl (ref 78.0–100.0)
Monocytes Absolute: 0.5 10*3/uL (ref 0.1–1.0)
Monocytes Relative: 10.7 % (ref 3.0–12.0)
Neutro Abs: 2.8 10*3/uL (ref 1.4–7.7)
Neutrophils Relative %: 57.6 % (ref 43.0–77.0)
Platelets: 180 10*3/uL (ref 150.0–400.0)
RBC: 4.9 Mil/uL (ref 4.22–5.81)
RDW: 14.2 % (ref 11.5–14.6)
WBC: 4.9 10*3/uL (ref 4.5–10.5)

## 2012-04-26 LAB — PSA: PSA: 0.88 ng/mL (ref 0.10–4.00)

## 2012-04-26 MED ORDER — FLUTICASONE PROPIONATE 50 MCG/ACT NA SUSP: 2.0000 | Freq: Every day | NASAL | Status: DC

## 2012-04-26 NOTE — Assessment & Plan Note (Signed)
Td 07 Ready for a Cscope by April, referral done Cont healthy life style Labs  FH of melanoma, self exam encouraged

## 2012-04-26 NOTE — Assessment & Plan Note (Signed)
Sx severe last year rec claritin prn as he is doing, add flonase

## 2012-04-26 NOTE — Patient Instructions (Signed)
Skin Cancer Most skin cancers occur on the face, neck, arms, or on sun-exposed areas. Damage from excessive sunlight exposure is the most common cause of skin cancer. People with light skin are more likely to develop skin cancer than people with more skin pigment, but skin cancers occur in people of all races. Squamous and basal cell cancers are the most common types. They are both slow-growing and easy to recognize. They often form scaly open sores that do not heal. Melanoma is a skin cancer that looks like a mole. Most moles are harmless, especially if they do not change over time. Most skin cancers can be prevented by avoiding excessive sunlight exposure. Signs that a skin sore or mole may be cancerous include:  A sore that does not heal or one that bleeds or slowly gets bigger.  Moles that are growing or are larger than a pencil eraser, or ones that have irregular borders or color variations.  Moles that have a bump, a raised area, or cause itching or pain. If you have a skin sore or mole that could be cancer, it is important that you call your doctor right away for a skin exam and possible skin biopsy to determine the best treatment. Document Released: 03/31/2004 Document Revised: 05/16/2011 Document Reviewed: 07/11/2008 [facility] Patient Information 2013 [place], [place].

## 2012-04-26 NOTE — Progress Notes (Signed)
  Subjective:    Patient ID: Jorge Humphrey, male    DOB: 01/10/1956, 57 y.o.   MRN: [id number]  HPI CPX  Past Medical History  Diagnosis Date  . PPD positive     h/o   Past Surgical History  Procedure Laterality Date  . Tonsillectomy    . Vasectomy    . Lipoma excision      R leg     Family History: no history of prostate or colon cancer glioblastoma-- B dx age 71, passed away 05-20-2010   No diabetes   Grandmother had CHF. HTN - no CAD - PGM and cousin have a pacemaker   stroke - no Melanoma-- F age ~ 75 Lymphoma -- father ~13  Social History: Married, 3 children. Patient is from [place], [place] from [place]. 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 Former Smoker (only smoked for a couple of year before the age of 57) Alcohol use-yes (1 drink a week) Drug use-no Diet-- healthy for the most part Regular exercise-yes (plays hockey, runs)  Review of Systems No chest pain or shortness or breath Note nausea, vomiting, diarrhea or blood in the stools. No dysuria or gross hematuria. Allergies  worse than usual last year.    Objective:   Physical Exam General -- alert, well-developed, BMI 24 .   Neck --no thyromegaly Lungs -- normal respiratory effort, no intercostal retractions, no accessory muscle use, and normal breath sounds.   Heart-- normal rate, regular rhythm, no murmur, and no gallop.   Abdomen--soft, non-tender, no distention, no masses, no HSM, no guarding, and no rigidity.   Extremities-- no pretibial edema bilaterally Rectal-- No external abnormalities noted. Normal sphincter tone. No rectal masses or tenderness. Brown stool, Hemoccult negative Prostate:  Prostate gland firm and smooth, no enlargement, nodularity, tenderness, mass, asymmetry or induration. Neurologic-- alert & oriented X3 and strength normal in all extremities. Psych-- Cognition and judgment appear intact. Alert and cooperative with normal  attention span and concentration.  not anxious appearing and not depressed appearing.        Assessment & Plan:

## 2012-06-18 ENCOUNTER — Ambulatory Visit (AMBULATORY_SURGERY_CENTER): Payer: 59 | Admitting: *Deleted

## 2012-06-18 VITALS — Ht 68.5 in | Wt 167.8 lb

## 2012-06-18 DIAGNOSIS — Z1211 Encounter for screening for malignant neoplasm of colon: Secondary | ICD-10-CM

## 2012-06-18 MED ORDER — MOVIPREP 100 G PO SOLR: ORAL | Status: DC

## 2012-07-02 ENCOUNTER — Encounter: Payer: 59 | Admitting: Internal Medicine

## 2012-07-06 ENCOUNTER — Encounter: Payer: Self-pay | Admitting: Internal Medicine

## 2012-07-06 ENCOUNTER — Ambulatory Visit (AMBULATORY_SURGERY_CENTER): Payer: 59 | Admitting: Internal Medicine

## 2012-07-06 VITALS — BP 114/73 | HR 47 | Temp 98.2°F | Resp 18 | Ht 68.0 in | Wt 167.0 lb

## 2012-07-06 DIAGNOSIS — Z1211 Encounter for screening for malignant neoplasm of colon: Secondary | ICD-10-CM

## 2012-07-06 MED ORDER — SODIUM CHLORIDE 0.9 % IV SOLN: 500.0000 mL | INTRAVENOUS | Status: DC

## 2012-07-06 NOTE — Op Note (Signed)
[facility] [address] [place], [address]   COLONOSCOPY PROCEDURE REPORT  PATIENT: Jorge Humphrey, Jorge Humphrey  MR#: [id number] BIRTHDATE: Jan 12, 1956 , 57  yrs. old GENDER: Male ENDOSCOPIST: Roxy Cedar, MD REFERRED XB:JYNW Drue Novel, M.D. PROCEDURE DATE:  07/06/2012 PROCEDURE:   Colonoscopy, screening ASA CLASS:   Class I INDICATIONS:average risk screening. MEDICATIONS: MAC sedation, administered by CRNA and propofol (Diprivan) 300mg  IV  DESCRIPTION OF PROCEDURE:   After the risks benefits and alternatives of the procedure were thoroughly explained, informed consent was obtained.  A digital rectal exam revealed no abnormalities of the rectum.   The LB CF-[id number]  endoscope was introduced through the anus and advanced to the cecum, which was identified by both the appendix and ileocecal valve. No adverse events experienced.   The quality of the prep was good, using MoviPrep  The instrument was then slowly withdrawn as the colon was fully examined.      COLON FINDINGS: A normal appearing cecum, ileocecal valve, and appendiceal orifice were identified.  The ascending, hepatic flexure, transverse, splenic flexure, descending, sigmoid colon and rectum appeared unremarkable.  No polyps or cancers were seen. Retroflexed views revealed internal hemorrhoids. The time to cecum=1 minutes 47 seconds.  Withdrawal time=11 minutes 16 seconds. The scope was withdrawn and the procedure completed. COMPLICATIONS: There were no complications.  ENDOSCOPIC IMPRESSION: 1. Normal colon  RECOMMENDATIONS: 1. Continue current colorectal screening recommendations for "routine risk" patients with a repeat colonoscopy in 10 years.   eSigned:  Roxy Cedar, MD 07/06/2012 9:37 AM   cc: Willow Ora, MD and The Patient

## 2012-07-06 NOTE — Patient Instructions (Addendum)
YOU HAD AN ENDOSCOPIC PROCEDURE TODAY AT THE [facility]: Refer to the procedure report that was given to you for any specific questions about what was found during the examination.  If the procedure report does not answer your questions, please call your gastroenterologist to clarify.  If you requested that your care partner not be given the details of your procedure findings, then the procedure report has been included in a sealed envelope for you to review at your convenience later.  YOU SHOULD EXPECT: Some feelings of bloating in the abdomen. Passage of more gas than usual.  Walking can help get rid of the air that was put into your GI tract during the procedure and reduce the bloating. If you had a lower endoscopy (such as a colonoscopy or flexible sigmoidoscopy) you may notice spotting of blood in your stool or on the toilet paper. If you underwent a bowel prep for your procedure, then you may not have a normal bowel movement for a few days.  DIET: Your first meal following the procedure should be a light meal and then it is ok to progress to your normal diet.  A half-sandwich or bowl of soup is an example of a good first meal.  Heavy or fried foods are harder to digest and may make you feel nauseous or bloated.  Likewise meals heavy in dairy and vegetables can cause extra gas to form and this can also increase the bloating.  Drink plenty of fluids but you should avoid alcoholic beverages for 24 hours.  ACTIVITY: Your care partner should take you home directly after the procedure.  You should plan to take it easy, moving slowly for the rest of the day.  You can resume normal activity the day after the procedure however you should NOT DRIVE or use heavy machinery for 24 hours (because of the sedation medicines used during the test).    SYMPTOMS TO REPORT IMMEDIATELY: A gastroenterologist can be reached at any hour.  During normal business hours, 8:30 AM to 5:00 PM Monday through Friday,  call [phone].  After hours and on weekends, please call the GI answering service at [phone] who will take a message and have the physician on call contact you.   Following lower endoscopy (colonoscopy or flexible sigmoidoscopy):  Excessive amounts of blood in the stool  Significant tenderness or worsening of abdominal pains  Swelling of the abdomen that is new, acute  Fever of 100F or higher  FOLLOW UP:.  Our staff will call the home number listed on your records the next business day following your procedure to check on you and address any questions or concerns that you may have at that time regarding the information given to you following your procedure. This is a courtesy call and so if there is no answer at the home number and we have not heard from you through the emergency physician on call, we will assume that you have returned to your regular daily activities without incident.  SIGNATURES/CONFIDENTIALITY: You and/or your care partner have signed paperwork which will be entered into your electronic medical record.  These signatures attest to the fact that that the information above on your After Visit Summary has been reviewed and is understood.  Full responsibility of the confidentiality of this discharge information lies with you and/or your care-partner.  Please continue your usual medications  Follow up colonoscopy in 10 years

## 2012-07-06 NOTE — Progress Notes (Signed)
Pt's heart rate in the 40's- he states this is normal for him.  Patient did not experience any of the following events: a burn prior to discharge; a fall within the facility; wrong site/side/patient/procedure/implant event; or a hospital transfer or hospital admission upon discharge from the facility. ([id number])Patient did not have preoperative order for IV antibiotic SSI prophylaxis. [phone])

## 2012-07-06 NOTE — Progress Notes (Signed)
A/ox3 pleased with MAC report to Kristen RN 

## 2012-07-09 ENCOUNTER — Telehealth: Payer: Self-pay | Admitting: *Deleted

## 2012-07-09 NOTE — Telephone Encounter (Signed)
  Follow up Call-  Call back number 07/06/2012  Post procedure Call Back phone  # [phone]  Permission to leave phone message Yes     Patient questions:  Do you have a fever, pain , or abdominal swelling? no Pain Score  0 *  Have you tolerated food without any problems? yes  Have you been able to return to your normal activities? yes  Do you have any questions about your discharge instructions: Diet   no Medications  no Follow up visit  no  Do you have questions or concerns about your Care? no  Actions: * If pain score is 4 or above: No action needed, pain <4.

## 2013-01-10 ENCOUNTER — Other Ambulatory Visit: Payer: Self-pay

## 2013-04-26 ENCOUNTER — Telehealth: Payer: Self-pay

## 2013-04-26 NOTE — Telephone Encounter (Signed)
Left message for call back Non identifiable  Tdap--07/2005 CCS--07/2012--Dr Perry--nml--next 2024 PSA--04/2012--0.88

## 2013-04-30 ENCOUNTER — Encounter: Payer: Self-pay | Admitting: Internal Medicine

## 2013-04-30 ENCOUNTER — Ambulatory Visit (INDEPENDENT_AMBULATORY_CARE_PROVIDER_SITE_OTHER): Payer: 59 | Admitting: Internal Medicine

## 2013-04-30 VITALS — BP 108/70 | HR 53 | Temp 97.8°F | Ht 69.0 in | Wt 168.0 lb

## 2013-04-30 DIAGNOSIS — Z0000 Encounter for general adult medical examination without abnormal findings: Secondary | ICD-10-CM

## 2013-04-30 LAB — CBC WITH DIFFERENTIAL/PLATELET
Basophils Absolute: 0 10*3/uL (ref 0.0–0.1)
Basophils Relative: 0.7 % (ref 0.0–3.0)
Eosinophils Absolute: 0.2 10*3/uL (ref 0.0–0.7)
Eosinophils Relative: 3.1 % (ref 0.0–5.0)
HCT: 44.2 % (ref 39.0–52.0)
Hemoglobin: 14.4 g/dL (ref 13.0–17.0)
Lymphocytes Relative: 27.2 % (ref 12.0–46.0)
Lymphs Abs: 1.4 10*3/uL (ref 0.7–4.0)
MCHC: 32.5 g/dL (ref 30.0–36.0)
MCV: 88.2 fl (ref 78.0–100.0)
Monocytes Absolute: 0.5 10*3/uL (ref 0.1–1.0)
Monocytes Relative: 10.4 % (ref 3.0–12.0)
Neutro Abs: 3 10*3/uL (ref 1.4–7.7)
Neutrophils Relative %: 58.6 % (ref 43.0–77.0)
Platelets: 182 10*3/uL (ref 150.0–400.0)
RBC: 5.01 Mil/uL (ref 4.22–5.81)
RDW: 14.2 % (ref 11.5–14.6)
WBC: 5.1 10*3/uL (ref 4.5–10.5)

## 2013-04-30 LAB — COMPREHENSIVE METABOLIC PANEL
ALT: 18 U/L (ref 0–53)
AST: 20 U/L (ref 0–37)
Albumin: 4.1 g/dL (ref 3.5–5.2)
Alkaline Phosphatase: 60 U/L (ref 39–117)
BUN: 15 mg/dL (ref 6–23)
CO2: 26 mEq/L (ref 19–32)
Calcium: 9.3 mg/dL (ref 8.4–10.5)
Chloride: 109 mEq/L (ref 96–112)
Creatinine, Ser: 0.9 mg/dL (ref 0.4–1.5)
GFR: 88.29 mL/min (ref >60.00)
Glucose, Bld: 105 mg/dL — ABNORMAL HIGH (ref 70–99)
Potassium: 4.5 mEq/L (ref 3.5–5.1)
Sodium: 142 mEq/L (ref 135–145)
Total Bilirubin: 0.6 mg/dL (ref 0.3–1.2)
Total Protein: 6.5 g/dL (ref 6.0–8.3)

## 2013-04-30 LAB — LIPID PANEL
Cholesterol: 182 mg/dL (ref 0–200)
HDL: 47.4 mg/dL (ref >39.00)
LDL Cholesterol: 127 mg/dL — ABNORMAL HIGH (ref 0–99)
Total CHOL/HDL Ratio: 4
Triglycerides: 39 mg/dL (ref 0.0–149.0)
VLDL: 7.8 mg/dL (ref 0.0–40.0)

## 2013-04-30 LAB — PSA: PSA: 0.88 ng/mL (ref 0.10–4.00)

## 2013-04-30 LAB — TSH: TSH: 1.82 u[IU]/mL (ref 0.35–5.50)

## 2013-04-30 NOTE — Patient Instructions (Signed)
Get your blood work before you leave   Next visit is for a physical exam in 1 year  fasting Please make an appointment     [place] are usually harmless growths on the skin. They are accumulations of color (pigment) cells in the skin that:   Can be various colors, from light brown to black.  Can appear anywhere on the body.  May remain flat or become raised.  May contain hairs.  May remain smooth or develop wrinkling. Most moles are not cancerous (benign). However, some moles may develop changes and become cancerous. It is important to check your moles every month. If you check your moles regularly, you will be able to notice any changes that may occur.  CAUSES  Moles occur when skin cells grow together in clusters instead of spreading out in the skin as they normally do. The reason for this clustering is unknown. DIAGNOSIS  Your caregiver will perform a skin examination to diagnose your mole.  TREATMENT  Moles usually do not require treatment. If a mole becomes worrisome, your caregiver may choose to take a sample of the mole or remove it entirely, and then send it to a lab for examination.  HOME CARE INSTRUCTIONS  Check your mole(s) monthly for changes that may indicate skin cancer. These changes can include:  A change in size.  A change in color. Note that moles tend to darken during pregnancy or when taking birth control pills (oral contraception).  A change in shape.  A change in the border of the mole.  Wear sunscreen (with an SPF of at least 20) when you spend long periods of time outside. Reapply the sunscreen every 2 3 hours.  Schedule annual appointments with your skin doctor (dermatologist) if you have a large number of moles. SEEK MEDICAL CARE IF:  Your mole changes size, especially if it becomes larger than a pencil eraser.  Your mole changes in color or develops more than one color.  Your mole becomes itchy or bleeds.  Your mole, or the skin near the  mole, becomes painful, sore, red, or swollen.  Your mole becomes scaly, sheds skin, or oozes fluid.  Your mole develops irregular borders.  Your mole becomes flat or develops raised areas.  Your mole becomes hard or soft. Document Released: 11/16/2000 Document Revised: 11/16/2011 Document Reviewed: 09/05/2011 [facility] Patient Information 2014 [place].

## 2013-04-30 NOTE — Assessment & Plan Note (Addendum)
Td 07 zostavax discussed  Cscope --07/2012--Dr Perry--nml--next 2024  Cont healthy life style Labs  FH of melanoma, self exam encouraged  occ shoulder pain (R), will call if needs a sports med referral

## 2013-04-30 NOTE — Progress Notes (Signed)
Subjective:    Patient ID: Jorge Humphrey, male    DOB: 06-28-1955, 58 y.o.   MRN: [id number]  DOS:  04/30/2013  CPX,    ROS Diet-- healthy most of the time  Exercise-- very active, hockey, gym No  CP, SOB No palpitations, no lower extremity edema denies  nausea, vomiting diarrhea Denies  blood in the stools (-) cough, sputum production (-) wheezing, chest congestion No dysuria, gross hematuria, difficulty urinating  No anxiety, depression     Past Medical History  Diagnosis Date  . PPD positive     h/o  . RBBB     Past Surgical History  Procedure Laterality Date  . Tonsillectomy    . Vasectomy    . Lipoma excision      R leg     History   Social History  . Marital Status: Married    Spouse Name: N/A    Number of Children: 3  . Years of Education: N/A   Occupational History  .  [employer]   Social History Main Topics  . Smoking status: Former Smoker    Quit date: 03/07/1978  . Smokeless tobacco: Never Used  . Alcohol Use: Yes     Comment: drinks 2 beers a month  . Drug Use: No  . Sexual Activity: Not on file   Other Topics Concern  . Not on file   Social History Narrative   Patient is from [place], [place] nort from [place].   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     Family History  Problem Relation Age of Onset  . Colon cancer Neg Hx   . Non-Hodgkin's lymphoma Father   . Diabetes Neg Hx   . CAD Neg Hx   . Heart block      pacemaker-cousin  . Prostate cancer Other     cousin at age 70  . Melanoma Father        Medication List       This list is accurate as of: 04/30/13 11:59 PM.  Always use your most recent med list.               fluticasone 50 MCG/ACT nasal spray  Commonly known as:  FLONASE  Place 2 sprays into the nose daily.     loratadine 10 MG tablet  Commonly known as:  CLARITIN  Take 10 mg by mouth daily.       Family History  Problem Relation Age of Onset    . Colon cancer Neg Hx   . Non-Hodgkin's lymphoma Father   . Diabetes Neg Hx   . CAD Neg Hx   . Heart block      pacemaker-cousin  . Prostate cancer Other     cousin at age 8  . Melanoma Father        Objective:   Physical Exam BP 108/70  Pulse 53  Temp(Src) 97.8 F (36.6 C) (Oral)  Ht 5\' 9"  (1.753 m)  Wt 168 lb (76.204 kg)  BMI 24.80 kg/m2  SpO2 100%  General -- alert, well-developed, NAD.  Neck --no thyromegaly , normal carotid pulse  HEENT-- Not pale.  Lungs -- normal respiratory effort, no intercostal retractions, no accessory muscle use, and normal breath sounds.  Heart-- normal rate, regular rhythm, no murmur.  Abdomen-- Not distended, good bowel sounds,soft, non-tender. Rectal-- No external abnormalities noted. Normal sphincter tone. No rectal masses or tenderness. Brown stool  Prostate--Prostate gland firm and smooth, no enlargement, nodularity, tenderness, mass, asymmetry or induration. Extremities-- no pretibial edema bilaterally  Neurologic--  alert & oriented X3. Speech normal, gait normal, strength normal in all extremities.  Psych-- Cognition and judgment appear intact. Cooperative with normal attention span and concentration. No anxious or depressed appearing.       Assessment & Plan:

## 2013-05-03 ENCOUNTER — Ambulatory Visit: Payer: 59

## 2013-05-03 DIAGNOSIS — R7309 Other abnormal glucose: Secondary | ICD-10-CM

## 2013-05-03 LAB — HEMOGLOBIN A1C: Hgb A1c MFr Bld: 6 % (ref 4.6–6.5)

## 2013-05-06 NOTE — Telephone Encounter (Signed)
Unable to reach prior to visit  

## 2013-10-17 ENCOUNTER — Encounter: Payer: Self-pay | Admitting: Sports Medicine

## 2013-10-17 ENCOUNTER — Ambulatory Visit (INDEPENDENT_AMBULATORY_CARE_PROVIDER_SITE_OTHER): Payer: 59 | Admitting: Sports Medicine

## 2013-10-17 VITALS — BP 108/78 | Ht 68.5 in | Wt 165.0 lb

## 2013-10-17 DIAGNOSIS — M7742 Metatarsalgia, left foot: Principal | ICD-10-CM

## 2013-10-17 DIAGNOSIS — M7741 Metatarsalgia, right foot: Secondary | ICD-10-CM | POA: Insufficient documentation

## 2013-10-17 DIAGNOSIS — M7750 Other enthesopathy of unspecified foot: Secondary | ICD-10-CM

## 2013-10-17 NOTE — Progress Notes (Signed)
  [place] - 58 y.o. male MRN [id number]  Date of birth: [birth year]  SUBJECTIVE:  Including CC & ROS.  The patient presents for evaluation of: Bilateral Forefoot pain: Pt reports bilateral second toe pain left greater than right and present since April. He is in active hockey player and occasional runner. He reports the pain was significant towards the end of the run to the point where he has completely stopped running. He denies any specific injury. Has no symptoms with this during hockey but does have pain first thing in the morning and after being on his feet for prolonged period denies any recent changes in running shoes. He has not tried any medications for other therapeutic modalities.   HISTORY: Past Medical, Surgical, Social, and Family History Reviewed & Updated per EMR. Pertinent Historical Findings include: Allergic rhinitis, prior right knee lipoma removal with no sequela or other orthopedic procedures  PHYSICAL EXAM:  VS: BP:108/78 mmHg  HR: bpm  TEMP: ( )  RESP:   HT:5' 8.5" (174 cm)   WT:165 lb (74.844 kg)  BMI:24.8 PHYSICAL EXAM: GENERAL:   adult Caucasian  male. In no discomfort; no respiratory distress  PSYCH:  alert and appropriate, good insight  Bilateral Foot Exam:        Gen/Palp:   He is tender to palpation of the plantar aspect of the second MTP. There's no swelling, no effusion, no erythema. Bilateral Morton's foot with early Morton's callus. Early right bunion formation on the right foot. Left early bunionette on left foot. His splint on between the second and third toes left greater than right. Moderate longitudinal arch without collapse during weightbearing      ROM/MST:  Full ankle plantar flexion, dorsiflexion, inversion and eversion bilateral ankles.  Strength is 5+ out of 5 in the above motions.                     NV:  Good capillary refill, sensation grossly intact.                 Tests:  Good posterior tibialis recruitment with toe off. No pain with  squeeze of the forefoot or Shuck test.  Walking Gait & Functional Exam:               General:  Neutral gait Strike Pattern:  Midfoot strike with    Foot Pattern:  Neutral landing                  Other:  Central toe off (underpronating)   ASSESSMENT & PLAN: See problem based charting & AVS for pt instructions.

## 2013-10-17 NOTE — Assessment & Plan Note (Addendum)
Acute on chronic condition  - bilateral Morton's foot with transverse arch collapse. 1. Patient placed in small metatarsal pads bilaterally. Gait and symptoms improved following this correction. Pt given instructions on correct placement and provided with HaPAD information. > follow up as needed.

## 2013-11-27 ENCOUNTER — Encounter: Payer: Self-pay | Admitting: Internal Medicine

## 2013-11-27 ENCOUNTER — Other Ambulatory Visit: Payer: Self-pay | Admitting: Internal Medicine

## 2013-11-27 DIAGNOSIS — R739 Hyperglycemia, unspecified: Secondary | ICD-10-CM

## 2013-12-03 ENCOUNTER — Telehealth: Payer: Self-pay | Admitting: Internal Medicine

## 2013-12-03 NOTE — Telephone Encounter (Signed)
Advise patient, needs a A1c recheck, order in

## 2013-12-03 NOTE — Telephone Encounter (Signed)
LMOM for Pt informing him he needs to make lab appt for a1c recheck.

## 2013-12-05 NOTE — Telephone Encounter (Signed)
Patient scheduled lab appointment 12/13/2013

## 2013-12-13 ENCOUNTER — Ambulatory Visit (INDEPENDENT_AMBULATORY_CARE_PROVIDER_SITE_OTHER): Payer: 59

## 2013-12-13 DIAGNOSIS — R739 Hyperglycemia, unspecified: Secondary | ICD-10-CM

## 2013-12-13 LAB — HEMOGLOBIN A1C: Hgb A1c MFr Bld: 5.8 % (ref 4.6–6.5)

## 2013-12-20 ENCOUNTER — Other Ambulatory Visit: Payer: Self-pay

## 2013-12-27 NOTE — Progress Notes (Signed)
Completed.

## 2014-08-11 IMAGING — CR DG CHEST 1V
1 series · 1 of 1 positions shown · non-contrast
Comparison: 05/26/2009

CLINICAL DATA: Positive PPD.  No current chest complaints

CHEST - 1 VIEW

[view not recorded]
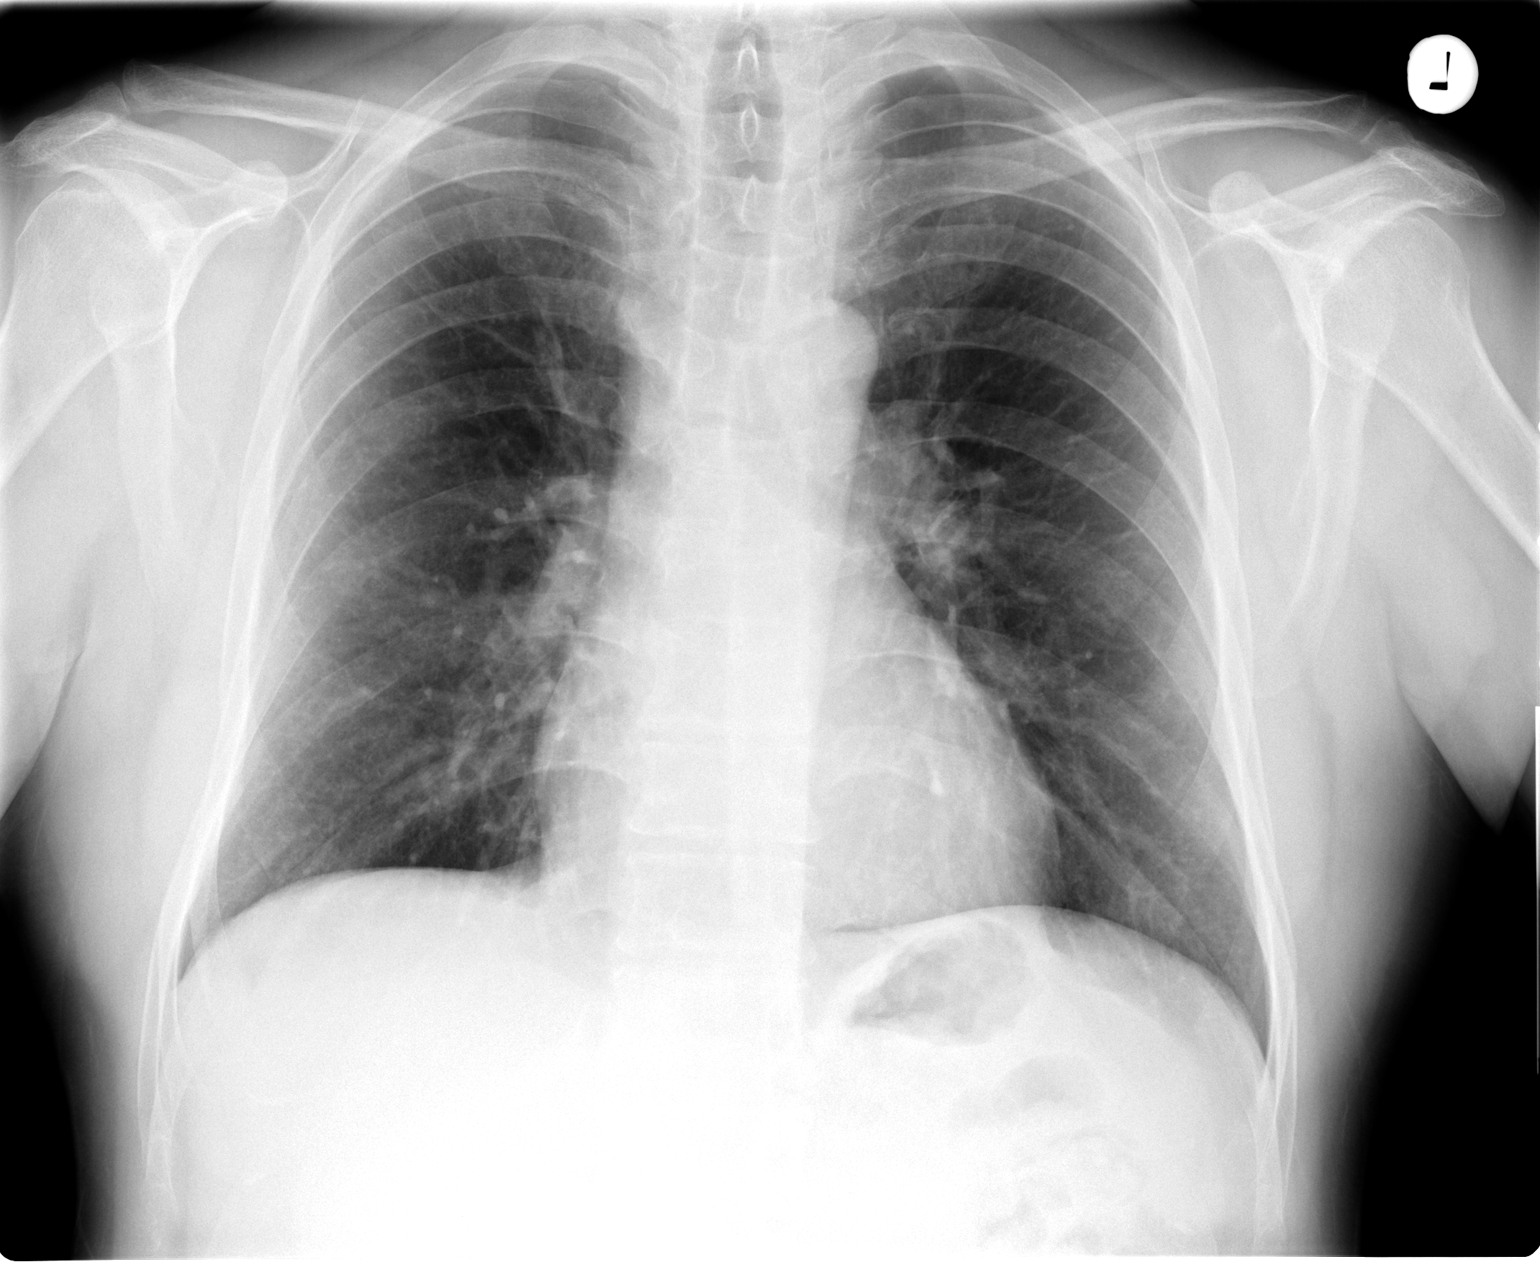

[1 of 1 positions shown; findings below may reference images not displayed]

FINDINGS: Heart and mediastinal contours are within normal limits.
The lung fields appear clear with no signs of focal infiltrate or
congestive failure.  No pleural fluid or significant peribronchial
cuffing is seen.

Bony structures appear intact.
IMPRESSION: Stable cardiopulmonary appearance with no new focal or acute
abnormality seen.  Specifically no radiographic evidence for
tuberculosis is identified.

## 2014-10-22 ENCOUNTER — Telehealth: Payer: Self-pay

## 2014-10-22 NOTE — Telephone Encounter (Signed)
Pre visit call completed 

## 2014-10-23 ENCOUNTER — Encounter: Payer: Self-pay | Admitting: Internal Medicine

## 2014-10-23 ENCOUNTER — Ambulatory Visit (INDEPENDENT_AMBULATORY_CARE_PROVIDER_SITE_OTHER): Payer: 59 | Admitting: Internal Medicine

## 2014-10-23 VITALS — BP 100/58 | HR 52 | Temp 98.0°F | Ht 68.25 in | Wt 164.5 lb

## 2014-10-23 DIAGNOSIS — Z114 Encounter for screening for human immunodeficiency virus [HIV]: Secondary | ICD-10-CM

## 2014-10-23 DIAGNOSIS — Z0000 Encounter for general adult medical examination without abnormal findings: Secondary | ICD-10-CM | POA: Diagnosis not present

## 2014-10-23 DIAGNOSIS — Z1159 Encounter for screening for other viral diseases: Secondary | ICD-10-CM

## 2014-10-23 LAB — BASIC METABOLIC PANEL
BUN: 15 mg/dL (ref 6–23)
CO2: 30 mEq/L (ref 19–32)
Calcium: 9.5 mg/dL (ref 8.4–10.5)
Chloride: 104 mEq/L (ref 96–112)
Creatinine, Ser: 0.95 mg/dL (ref 0.40–1.50)
GFR: 86.76 mL/min (ref >60.00)
Glucose, Bld: 98 mg/dL (ref 70–99)
Potassium: 4.2 mEq/L (ref 3.5–5.1)
Sodium: 140 mEq/L (ref 135–145)

## 2014-10-23 LAB — LIPID PANEL
Cholesterol: 179 mg/dL (ref 0–200)
HDL: 46.1 mg/dL (ref >39.00)
LDL Cholesterol: 122 mg/dL — ABNORMAL HIGH (ref 0–99)
NonHDL: 132.63
Total CHOL/HDL Ratio: 4
Triglycerides: 51 mg/dL (ref 0.0–149.0)
VLDL: 10.2 mg/dL (ref 0.0–40.0)

## 2014-10-23 LAB — HEMOGLOBIN A1C: Hgb A1c MFr Bld: 5.7 % (ref 4.6–6.5)

## 2014-10-23 NOTE — Progress Notes (Signed)
Pre visit review using our clinic review tool, if applicable. No additional management support is needed unless otherwise documented below in the visit note. 

## 2014-10-23 NOTE — Patient Instructions (Signed)
Get your blood work before you leave    

## 2014-10-23 NOTE — Progress Notes (Signed)
Subjective:    Patient ID: Jorge Humphrey, male    DOB: 27-Jun-1955, 59 y.o.   MRN: [id number]  DOS:  10/23/2014 Type of visit - description : cpx Interval history: In general doing well, he remains active and trying to eat healthy    Review of Systems Constitutional: No fever. No chills. No unexplained wt changes. No unusual sweats  HEENT: No dental problems, no ear discharge, no facial swelling, no voice changes. No eye discharge, no eye  redness , no  intolerance to light   Respiratory: No wheezing , no  difficulty breathing. No cough , no mucus production  Cardiovascular: No CP, no leg swelling , no  Palpitations  GI: no nausea, no vomiting, no diarrhea , no  abdominal pain.  No blood in the stools. No dysphagia, no odynophagia    Endocrine: No polyphagia, no polyuria , no polydipsia  GU: No dysuria, gross hematuria, difficulty urinating. No urinary urgency, no frequency.  Musculoskeletal: No joint swellings or unusual aches or pains  Skin: No change in the color of the skin, palor , no  Rash  Allergic, immunologic: No environmental allergies , no  food allergies  Neurological: No dizziness no  syncope. No headaches. No diplopia, no slurred, no slurred speech, no motor deficits, no facial  Numbness  Hematological: No enlarged lymph nodes, no easy bruising , no unusual bleedings  Psychiatry: No suicidal ideas, no hallucinations, no beavior problems, no confusion.  No unusual/severe anxiety, no depression   Past Medical History  Diagnosis Date  . PPD positive     h/o  . RBBB     Past Surgical History  Procedure Laterality Date  . Tonsillectomy    . Vasectomy    . Lipoma excision      R leg     Social History   Social History  . Marital Status: Married    Spouse Name: N/A  . Number of Children: 3  . Years of Education: N/A   Occupational History  . RN [employer]   Social History Main Topics  . Smoking status: Former Smoker    Quit date:  03/07/1978  . Smokeless tobacco: Never Used  . Alcohol Use: Yes     Comment: drinks 2 beers a month  . Drug Use: No  . Sexual Activity: Not on file   Other Topics Concern  . Not on file   Social History Narrative   Patient is from [place], 6 hour nort from [place].   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     Family History  Problem Relation Age of Onset  . Colon cancer Neg Hx   . Non-Hodgkin's lymphoma Father   . Diabetes Neg Hx   . CAD Neg Hx   . Heart block      pacemaker-cousin  . Prostate cancer Other     cousin at age 64  . Melanoma Father   . Cancer Brother     glioblastoma       Medication List       This list is accurate as of: 10/23/14  1:41 PM.  Always use your most recent med list.               fluticasone 50 MCG/ACT nasal spray  Commonly known as:  FLONASE  Place 2 sprays into the nose daily.     loratadine 10 MG tablet  Commonly known as:  CLARITIN  Take 10  mg by mouth daily.           Objective:   Physical Exam BP 100/58 mmHg  Pulse 52  Temp(Src) 98 F (36.7 C) (Oral)  Ht 5' 8.25" (1.734 m)  Wt 164 lb 8 oz (74.617 kg)  BMI 24.82 kg/m2  SpO2 98% General:   Well developed, well nourished . NAD.  Neck:  Full range of motion. Supple. No  thyromegaly , normal carotid pulse HEENT:  Normocephalic . Face symmetric, atraumatic Lungs:  CTA B Normal respiratory effort, no intercostal retractions, no accessory muscle use. Heart: RRR,  no murmur.  No pretibial edema bilaterally  Abdomen:  Not distended, soft, non-tender. No rebound or rigidity. No mass,organomegaly Skin: Exposed areas without rash. Not pale. Not jaundice Neurologic:  alert & oriented X3.  Speech normal, gait appropriate for age and unassisted Strength symmetric and appropriate for age.  Psych: Cognition and judgment appear intact.  Cooperative with normal attention span and concentration.  Behavior appropriate. No  anxious or depressed appearing.     Assessment & Plan:

## 2014-10-23 NOTE — Assessment & Plan Note (Addendum)
Td 07 zostavax discussed  before Cscope --07/2012--Dr Perry--nml--next 2024  Prostate cancer screening: DRE last year normal, PSA has been consistently negative Cont healthy life style Labs  FH of melanoma reports self examination is within normal     History of prediabetes: Check A1c, continue with her healthy lifestyle

## 2014-10-24 LAB — HIV ANTIBODY (ROUTINE TESTING W REFLEX): HIV 1&2 Ab, 4th Generation: NONREACTIVE

## 2014-10-24 LAB — HEPATITIS C ANTIBODY: HCV Ab: NEGATIVE

## 2015-06-09 DIAGNOSIS — L821 Other seborrheic keratosis: Secondary | ICD-10-CM | POA: Diagnosis not present

## 2015-06-09 DIAGNOSIS — D225 Melanocytic nevi of trunk: Secondary | ICD-10-CM | POA: Diagnosis not present

## 2015-06-09 DIAGNOSIS — L814 Other melanin hyperpigmentation: Secondary | ICD-10-CM | POA: Diagnosis not present

## 2015-06-09 DIAGNOSIS — D2271 Melanocytic nevi of right lower limb, including hip: Secondary | ICD-10-CM | POA: Diagnosis not present

## 2015-08-05 ENCOUNTER — Ambulatory Visit (INDEPENDENT_AMBULATORY_CARE_PROVIDER_SITE_OTHER): Payer: 59 | Admitting: Internal Medicine

## 2015-08-05 ENCOUNTER — Encounter: Payer: Self-pay | Admitting: Internal Medicine

## 2015-08-05 VITALS — BP 112/80 | HR 67 | Temp 98.0°F | Ht 68.25 in | Wt 170.4 lb

## 2015-08-05 DIAGNOSIS — Z09 Encounter for follow-up examination after completed treatment for conditions other than malignant neoplasm: Secondary | ICD-10-CM | POA: Insufficient documentation

## 2015-08-05 DIAGNOSIS — G4700 Insomnia, unspecified: Secondary | ICD-10-CM | POA: Diagnosis not present

## 2015-08-05 MED ORDER — ALPRAZOLAM 0.5 MG PO TABS: 0.5000 mg | ORAL_TABLET | Freq: Every evening | ORAL | Status: DC | PRN

## 2015-08-05 MED FILL — ALPRAZolam 0.5 MG TABS: 0.5 | 30 days supply | Qty: 30 | Fill #0

## 2015-08-05 NOTE — Progress Notes (Signed)
   Subjective:    Patient ID: Jorge Humphrey, male    DOB: 09-Jun-1955, 60 y.o.   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   Review of Systems Denies depression or anxiety per se  Past Medical History  Diagnosis Date  . PPD positive     h/o  . RBBB     Past Surgical History  Procedure Laterality Date  . Tonsillectomy    . Vasectomy    . Lipoma excision      R leg     Social History   Social History  . Marital Status: Married    Spouse Name: N/A  . Number of Children: 3  . Years of Education: N/A   Occupational History  . RN [employer]   Social History Main Topics  . Smoking status: Former Smoker    Quit date: 03/07/1978  . Smokeless tobacco: Never Used  . Alcohol Use: Yes     Comment: drinks 2 beers a month  . Drug Use: No  . Sexual Activity: Not on file   Other Topics Concern  . Not on file   Social History Narrative   Patient is from [place], 6 hour nort from [place].   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        Medication List       This list is accurate as of: 08/05/15  5:03 PM.  Always use your most recent med list.               ALPRAZolam 0.5 MG tablet  Commonly known as:  XANAX  Take 1 tablet (0.5 mg total) by mouth at bedtime as needed for anxiety.     fluticasone 50 MCG/ACT nasal spray  Commonly known as:  FLONASE  Place 2 sprays into the nose daily.     loratadine 10 MG tablet  Commonly known as:  CLARITIN  Take 10 mg by mouth daily as needed.           Objective:   Physical Exam BP 112/80 mmHg   Pulse 67  Temp(Src) 98 F (36.7 C) (Oral)  Ht 5' 8.25" (1.734 m)  Wt 170 lb 6.4 oz (77.293 kg)  BMI 25.71 kg/m2  SpO2 98% General:   Well developed, well nourished . NAD.  HEENT:  Normocephalic . Face symmetric, atraumatic Skin: Not pale. Not jaundice Neurologic:  alert & oriented X3.  Speech normal, gait appropriate for age and unassisted Psych--  Cognition and judgment appear intact.  Cooperative with normal attention span and concentration.  Behavior appropriate. No anxious or depressed appearing.      Assessment & Plan:   Assessment History + PPD, s/p treatment  PLAN: Insomnia: Related to stress at work, tips for healthy sleep discussed, failed to improve with Benadryl. We discussed xanax (risk of abuse) versus Ambien, elected Xanax. See prescription. Will call for a refill if needed. RTC by 10-2015 for a CPX

## 2015-08-05 NOTE — Patient Instructions (Addendum)
Try xanax: 1/2 to 1 tab at night as needed Ok to take up to 1.5 if needed  Next visit , CPX by 10-2015    HEALTHY SLEEP Sleep hygiene: Basic rules for a good night's sleep  Sleep only as much as you need to feel rested and then get out of bed  Keep a regular sleep schedule  Avoid forcing sleep  Exercise regularly for at least 20 minutes, preferably 4 to 5 hours before bedtime  Avoid caffeinated beverages after lunch  Avoid alcohol near bedtime: no "night cap"  Avoid smoking, especially in the evening  Do not go to bed hungry  Adjust bedroom environment  Avoid prolonged use of light-emitting screens before bedtime   Deal with your worries before bedtime

## 2015-08-05 NOTE — Progress Notes (Signed)
Pre visit review using our clinic review tool, if applicable. No additional management support is needed unless otherwise documented below in the visit note. 

## 2015-08-05 NOTE — Assessment & Plan Note (Signed)
Insomnia: Related to stress at work, tips for healthy sleep discussed, failed to improve with Benadryl. We discussed xanax (risk of abuse) versus Ambien, elected Xanax. See prescription. Will call for a refill if needed. RTC by 10-2015 for a CPX

## 2015-11-12 ENCOUNTER — Encounter: Payer: Self-pay | Admitting: Internal Medicine

## 2015-11-19 ENCOUNTER — Ambulatory Visit: Payer: 59 | Admitting: Internal Medicine

## 2015-12-01 ENCOUNTER — Ambulatory Visit (INDEPENDENT_AMBULATORY_CARE_PROVIDER_SITE_OTHER): Payer: 59 | Admitting: Internal Medicine

## 2015-12-01 ENCOUNTER — Encounter: Payer: Self-pay | Admitting: Internal Medicine

## 2015-12-01 VITALS — BP 118/68 | HR 77 | Temp 98.7°F | Resp 14 | Ht 68.25 in | Wt 167.1 lb

## 2015-12-01 DIAGNOSIS — E041 Nontoxic single thyroid nodule: Secondary | ICD-10-CM

## 2015-12-01 NOTE — Patient Instructions (Signed)
Schedule a physical at your convenience  

## 2015-12-01 NOTE — Progress Notes (Signed)
Pre visit review using our clinic review tool, if applicable. No additional management support is needed unless otherwise documented below in the visit note. 

## 2015-12-01 NOTE — Progress Notes (Signed)
Subjective:    Patient ID: Jorge Humphrey, male    DOB: [birth year], 60 y.o.   MRN: [id number]  DOS:  12/01/2015 Type of visit - description : Acute visit Interval history:  Martin Majestic to his dentist  ~ 8- 2017, he felt like the patient had possibly a left thyroid nodule. Self check is negative. Ultrasound?   Review of Systems Denies fever, chills or anterior neck pain No difficulty swallowing No weight changes  Past Medical History:  Diagnosis Date  . PPD positive    h/o  . RBBB     Past Surgical History:  Procedure Laterality Date  . LIPOMA EXCISION     R leg   . TONSILLECTOMY    . VASECTOMY      Social History   Social History  . Marital status: Married    Spouse name: N/A  . Number of children: 3  . Years of education: N/A   Occupational History  . RN [employer]   Social History Main Topics  . Smoking status: Former Smoker    Quit date: 03/07/1978  . Smokeless tobacco: Never Used  . Alcohol use Yes     Comment: drinks 2 beers a month  . Drug use: No  . Sexual activity: Not on file   Other Topics Concern  . Not on file   Social History Narrative   Patient is from [place], 6 hour nort from [place].   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        Medication List       Accurate as of 12/01/15 10:37 AM. Always use your most recent med list.          ALPRAZolam 0.5 MG tablet Commonly known as:  XANAX Take 1 tablet (0.5 mg total) by mouth at bedtime as needed for anxiety.   cetirizine 10 MG tablet Commonly known as:  ZYRTEC Take 10 mg by mouth daily as needed for allergies.   fluticasone 50 MCG/ACT nasal spray Commonly known as:  FLONASE Place 2 sprays into the nose daily.   loratadine 10 MG tablet Commonly known as:  CLARITIN Take 10 mg by mouth daily as needed.          Objective:   Physical Exam BP 118/68 (BP Location: Left Arm, Patient Position: Sitting, Cuff Size: Small)   Pulse 77    Temp 98.7 F (37.1 C) (Oral)   Resp 14   Ht 5' 8.25" (1.734 m)   Wt 167 lb 2 oz (75.8 kg)   SpO2 97%   BMI 25.23 kg/m  General:   Well developed, well nourished . NAD.  HEENT:  Normocephalic . Face symmetric, atraumatic. Throat symmetric and without lesions. Good dental health on exam Neck: No thyromegaly, LADs, submandibular or supraclavicular mass. Skin: Not pale. Not jaundice Neurologic:  alert & oriented X3.  Speech normal, gait appropriate for age and unassisted Psych--  Cognition and judgment appear intact.  Cooperative with normal attention span and concentration.  Behavior appropriate. No anxious or depressed appearing.      Assessment & Plan:   Assessment History + PPD, s/p treatment  PLAN: Thyroid nodule? Felt by his dentist a few months ago, self-examination is normal, examination by me today is negative. I initially recommended an ultrasound to "settle the issue" and be sure we are not missing a nodule; ,  he would like to postpone it for a few months, I am not opposed  since the exam is quite benign and he has no symptoms. He will call when he is ready for ultrasound and a CPX in the meantime we'll continue self-monitoring the area.

## 2015-12-01 NOTE — Assessment & Plan Note (Signed)
Thyroid nodule? Felt by his dentist a few months ago, self-examination is normal, examination by me today is negative. I initially recommended an ultrasound to "settle the issue" and be sure we are not missing a nodule; ,  he would like to postpone it for a few months, I am not opposed since the exam is quite benign and he has no symptoms. He will call when he is ready for ultrasound and a CPX in the meantime we'll continue self-monitoring the area.

## 2016-01-19 ENCOUNTER — Encounter: Payer: Self-pay | Admitting: Internal Medicine

## 2016-01-19 DIAGNOSIS — E041 Nontoxic single thyroid nodule: Secondary | ICD-10-CM

## 2016-01-19 NOTE — Telephone Encounter (Signed)
Order placed

## 2016-01-19 NOTE — Telephone Encounter (Signed)
needs ultrasound, see message DX thyroid nodule  Received: Today  [place], MD  Damita Dunnings, CMA

## 2016-02-23 ENCOUNTER — Ambulatory Visit (HOSPITAL_BASED_OUTPATIENT_CLINIC_OR_DEPARTMENT_OTHER)
Admission: RE | Admit: 2016-02-23 | Discharge: 2016-02-23 | Disposition: A | Discharge status: Home / Self Care | Payer: 59 | Source: Ambulatory Visit | Attending: Internal Medicine | Admitting: Internal Medicine

## 2016-02-23 DIAGNOSIS — E041 Nontoxic single thyroid nodule: Secondary | ICD-10-CM | POA: Insufficient documentation

## 2016-02-26 ENCOUNTER — Encounter: Payer: Self-pay | Admitting: Internal Medicine

## 2016-04-04 DIAGNOSIS — H5203 Hypermetropia, bilateral: Secondary | ICD-10-CM | POA: Diagnosis not present

## 2016-06-17 DIAGNOSIS — D225 Melanocytic nevi of trunk: Secondary | ICD-10-CM | POA: Diagnosis not present

## 2016-06-17 DIAGNOSIS — L814 Other melanin hyperpigmentation: Secondary | ICD-10-CM | POA: Diagnosis not present

## 2016-06-17 DIAGNOSIS — L708 Other acne: Secondary | ICD-10-CM | POA: Diagnosis not present

## 2016-06-17 DIAGNOSIS — D2271 Melanocytic nevi of right lower limb, including hip: Secondary | ICD-10-CM | POA: Diagnosis not present

## 2016-06-17 DIAGNOSIS — L820 Inflamed seborrheic keratosis: Secondary | ICD-10-CM | POA: Diagnosis not present

## 2016-10-18 DIAGNOSIS — L821 Other seborrheic keratosis: Secondary | ICD-10-CM | POA: Diagnosis not present

## 2016-10-18 DIAGNOSIS — L905 Scar conditions and fibrosis of skin: Secondary | ICD-10-CM | POA: Diagnosis not present

## 2016-10-18 DIAGNOSIS — L719 Rosacea, unspecified: Secondary | ICD-10-CM | POA: Diagnosis not present

## 2016-12-15 ENCOUNTER — Ambulatory Visit (INDEPENDENT_AMBULATORY_CARE_PROVIDER_SITE_OTHER): Payer: 59 | Admitting: Internal Medicine

## 2016-12-15 ENCOUNTER — Encounter: Payer: Self-pay | Admitting: Internal Medicine

## 2016-12-15 VITALS — BP 118/78 | HR 58 | Temp 98.1°F | Resp 14 | Ht 68.0 in | Wt 163.4 lb

## 2016-12-15 DIAGNOSIS — J309 Allergic rhinitis, unspecified: Secondary | ICD-10-CM | POA: Diagnosis not present

## 2016-12-15 DIAGNOSIS — Z0000 Encounter for general adult medical examination without abnormal findings: Secondary | ICD-10-CM

## 2016-12-15 LAB — CBC WITH DIFFERENTIAL/PLATELET
Basophils Absolute: 0.1 10*3/uL (ref 0.0–0.1)
Basophils Relative: 1.3 % (ref 0.0–3.0)
Eosinophils Absolute: 0.2 10*3/uL (ref 0.0–0.7)
Eosinophils Relative: 4.5 % (ref 0.0–5.0)
HCT: 43.5 % (ref 39.0–52.0)
Hemoglobin: 14.6 g/dL (ref 13.0–17.0)
Lymphocytes Relative: 25.5 % (ref 12.0–46.0)
Lymphs Abs: 1.3 10*3/uL (ref 0.7–4.0)
MCHC: 33.6 g/dL (ref 30.0–36.0)
MCV: 90 fl (ref 78.0–100.0)
Monocytes Absolute: 0.6 10*3/uL (ref 0.1–1.0)
Monocytes Relative: 11.2 % (ref 3.0–12.0)
Neutro Abs: 2.9 10*3/uL (ref 1.4–7.7)
Neutrophils Relative %: 57.5 % (ref 43.0–77.0)
Platelets: 175 10*3/uL (ref 150.0–400.0)
RBC: 4.84 Mil/uL (ref 4.22–5.81)
RDW: 14.1 % (ref 11.5–15.5)
WBC: 5.1 10*3/uL (ref 4.0–10.5)

## 2016-12-15 LAB — LIPID PANEL
Cholesterol: 188 mg/dL (ref 0–200)
HDL: 46.2 mg/dL (ref >39.00)
LDL Cholesterol: 130 mg/dL — ABNORMAL HIGH (ref 0–99)
NonHDL: 142.07
Total CHOL/HDL Ratio: 4
Triglycerides: 61 mg/dL (ref 0.0–149.0)
VLDL: 12.2 mg/dL (ref 0.0–40.0)

## 2016-12-15 LAB — COMPREHENSIVE METABOLIC PANEL
ALT: 16 U/L (ref 0–53)
AST: 15 U/L (ref 0–37)
Albumin: 4.2 g/dL (ref 3.5–5.2)
Alkaline Phosphatase: 52 U/L (ref 39–117)
BUN: 13 mg/dL (ref 6–23)
CO2: 30 mEq/L (ref 19–32)
Calcium: 9 mg/dL (ref 8.4–10.5)
Chloride: 104 mEq/L (ref 96–112)
Creatinine, Ser: 0.97 mg/dL (ref 0.40–1.50)
GFR: 84.06 mL/min (ref >60.00)
Glucose, Bld: 102 mg/dL — ABNORMAL HIGH (ref 70–99)
Potassium: 4.1 mEq/L (ref 3.5–5.1)
Sodium: 140 mEq/L (ref 135–145)
Total Bilirubin: 0.7 mg/dL (ref 0.2–1.2)
Total Protein: 6.2 g/dL (ref 6.0–8.3)

## 2016-12-15 LAB — HEMOGLOBIN A1C: Hgb A1c MFr Bld: 5.7 % (ref 4.6–6.5)

## 2016-12-15 LAB — PSA: PSA: 0.93 ng/mL (ref 0.10–4.00)

## 2016-12-15 LAB — TSH: TSH: 1.67 u[IU]/mL (ref 0.35–4.50)

## 2016-12-15 MED ORDER — ALPRAZOLAM 0.5 MG PO TABS: 0.5000 mg | ORAL_TABLET | Freq: Every evening | ORAL | 1 refills | Status: DC | PRN

## 2016-12-15 MED ORDER — FLUTICASONE PROPIONATE 50 MCG/ACT NA SUSP: 2.0000 | Freq: Every day | NASAL | 12 refills | Status: DC

## 2016-12-15 MED ORDER — ALPRAZOLAM 0.5 MG PO TABS: 0.5000 mg | ORAL_TABLET | Freq: Every evening | ORAL | 0 refills | Status: DC | PRN

## 2016-12-15 MED FILL — FLUTICASONE PROP 50 MCG SPR: 50 | 30 days supply | Qty: 16 | Fill #0

## 2016-12-15 MED FILL — ALPRAZolam 0.5 MG TABS: 0.5 | 30 days supply | Qty: 30 | Fill #0

## 2016-12-15 NOTE — Patient Instructions (Signed)
GO TO THE LAB : Get the blood work     GO TO THE FRONT DESK Schedule your next appointment for a  physical exam in one year  

## 2016-12-15 NOTE — Assessment & Plan Note (Signed)
Hyperglycemia: Check A1c Insomnia: on xanax  very seldom, Rx provided, contract signed. Pain, finger: See physical exam, some tenderness on the tendon, tendinitis? 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

## 2016-12-15 NOTE — Assessment & Plan Note (Signed)
Td 2009, booster will be at work; flu shot at work; shingles shot discussed  -CCS: Cscope --07/2012--Dr Perry--nml--next 2024  -Prostate cancer screening: DRE today wnl, check a PSA  -Cont healthy life style -Labs : CMP, FLP, CBC, A1c, TSH, PSA

## 2016-12-15 NOTE — Progress Notes (Signed)
Pre visit review using our clinic review tool, if applicable. No additional management support is needed unless otherwise documented below in the visit note. 

## 2016-12-15 NOTE — Progress Notes (Signed)
Subjective:    Patient ID: Jorge Humphrey, male    DOB: 04-Apr-1955, 61 y.o.   MRN: [id number]  DOS:  12/15/2016 Type of visit - description : cpx Interval history: Doing well, no major concerns   Review of Systems 2 weeks history of pain at the right middle finger, no swelling or redness. Most of the pain is at the PIP, some radiation proximally  Other than above, a 14 point review of systems is negative     Past Medical History:  Diagnosis Date  . PPD positive    h/o  . RBBB     Past Surgical History:  Procedure Laterality Date  . LIPOMA EXCISION     R leg   . TONSILLECTOMY    . VASECTOMY      Social History   Social History  . Marital status: Married    Spouse name: N/A  . Number of children: 3  . Years of education: N/A   Occupational History  . RN [employer]   Social History Main Topics  . Smoking status: Former Smoker    Quit date: 03/07/1978  . Smokeless tobacco: Never Used  . Alcohol use Yes     Comment: drinks 2 beers a month  . Drug use: No  . Sexual activity: Not on file   Other Topics Concern  . Not on file   Social History Narrative   Patient is from [place], 6 hour nort from [place].   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 Depression Daughter   . Migraines Daughter   . Non-Hodgkin's lymphoma Father   . Melanoma Father   . Heart block Unknown        pacemaker-cousin  . Prostate cancer Other        cousin at age 68  . Cancer Brother        glioblastoma  . Colon cancer Neg Hx   . Diabetes Neg Hx   . CAD Neg Hx      Allergies as of 12/15/2016   No Known Allergies     Medication List       Accurate as of 12/15/16  2:45 PM. Always use your most recent med list.          ALPRAZolam 0.5 MG tablet Commonly known as:  XANAX Take 1 tablet (0.5 mg total) by mouth at bedtime as needed for anxiety.   cetirizine 10 MG tablet Commonly known as:   ZYRTEC Take 10 mg by mouth daily as needed for allergies.   fluticasone 50 MCG/ACT nasal spray Commonly known as:  FLONASE Place 2 sprays into both nostrils daily.   loratadine 10 MG tablet Commonly known as:  CLARITIN Take 10 mg by mouth daily as needed.          Objective:   Physical Exam BP 118/78 (BP Location: Left Arm, Patient Position: Sitting, Cuff Size: Small)   Pulse (!) 58   Temp 98.1 F (36.7 C) (Oral)   Resp 14   Ht 5\' 8"  (1.727 m)   Wt 163 lb 6 oz (74.1 kg)   SpO2 97%   BMI 24.84 kg/m   General:   Well developed, well nourished . NAD.  Neck: No  thyromegaly  HEENT:  Normocephalic . Face symmetric, atraumatic Lungs:  CTA B Normal respiratory effort, no intercostal retractions, no accessory muscle use. Heart: RRR,  no  murmur.  No pretibial edema bilaterally  Abdomen:  Not distended, soft, non-tender. No rebound or rigidity.   Skin: Exposed areas without rash. Not pale. Not jaundice Rectal:  External abnormalities: none. Normal sphincter tone. No rectal masses or tenderness.  Stool brown  Prostate: Prostate gland firm and smooth, no enlargement, nodularity, tenderness, mass, asymmetry or induration.  MSK: Hands normal except for mild TTP at the right middle finger PIP, the tendon at the palmar aspect from that finger is also slightly TTP. No redness or swelling Neurologic:  alert & oriented X3.  Speech normal, gait appropriate for age and unassisted Strength symmetric and appropriate for age.  Psych: Cognition and judgment appear intact.  Cooperative with normal attention span and concentration.  Behavior appropriate. No anxious or depressed appearing.    Assessment & Plan:  Assessment A1C 6.0 (2009)  History + PPD, s/p treatment +FH melanoma: sees derm q year  PLAN: Hyperglycemia: Check A1c Insomnia: on xanax  very seldom, Rx provided, contract signed. Pain, finger: See physical exam, some tenderness on the tendon, tendinitis? 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

## 2017-02-17 DIAGNOSIS — M79641 Pain in right hand: Secondary | ICD-10-CM | POA: Diagnosis not present

## 2017-02-17 DIAGNOSIS — M65331 Trigger finger, right middle finger: Secondary | ICD-10-CM | POA: Diagnosis not present

## 2017-03-07 HISTORY — PX: TRIGGER FINGER RELEASE: SHX641

## 2017-03-22 DIAGNOSIS — M6530 Trigger finger, unspecified finger: Secondary | ICD-10-CM | POA: Diagnosis not present

## 2017-04-26 DIAGNOSIS — M65331 Trigger finger, right middle finger: Secondary | ICD-10-CM | POA: Diagnosis not present

## 2017-07-06 IMAGING — US US THYROID
1 series · 14 of 25 positions shown · non-contrast
Comparison: None.

CLINICAL DATA: Nodule on physical exam

EXAM:
THYROID ULTRASOUND
TECHNIQUE: Ultrasound examination of the thyroid gland and adjacent soft
tissues was performed.

[Series 1: us thyroid · 0.06mm/px · 14 of 27 slices shown]
[im 1/27]
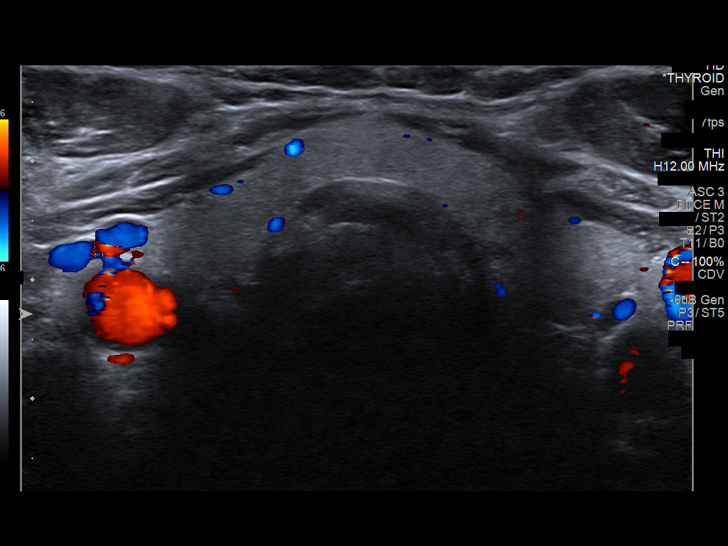
[im 3/27]
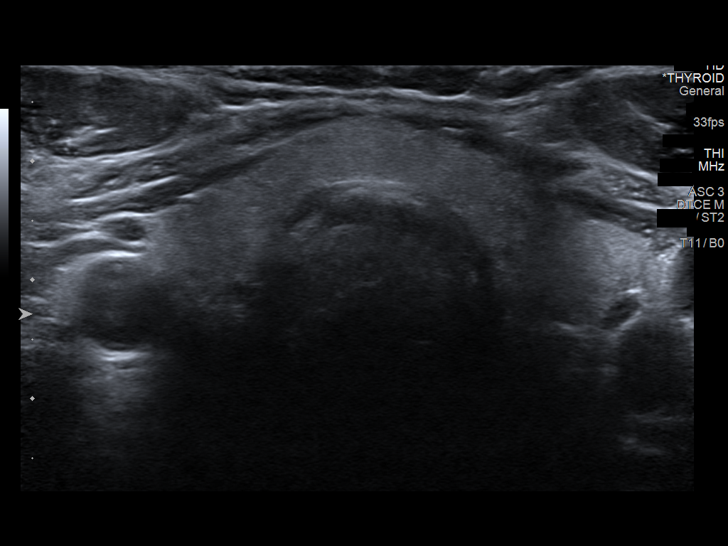
[im 5/27]
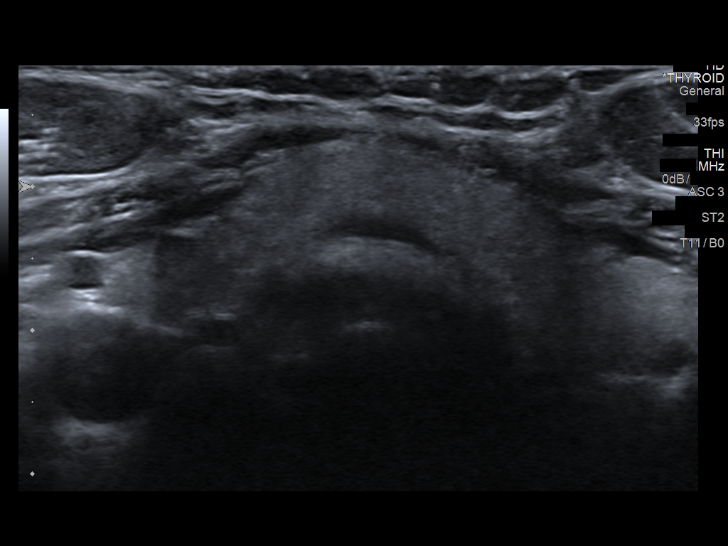
[im 7/27]
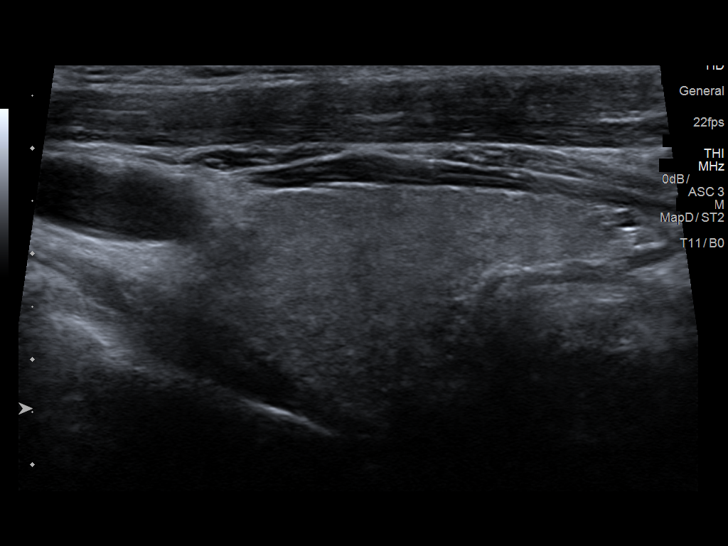
[im 9/27]
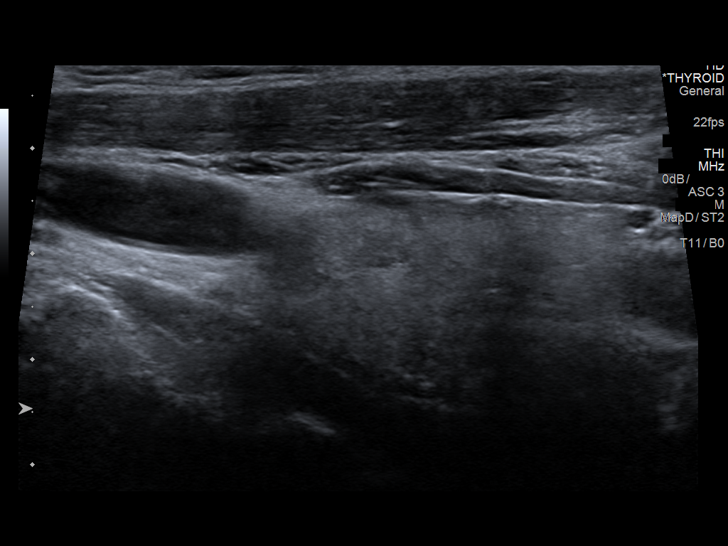
[im 10/27]
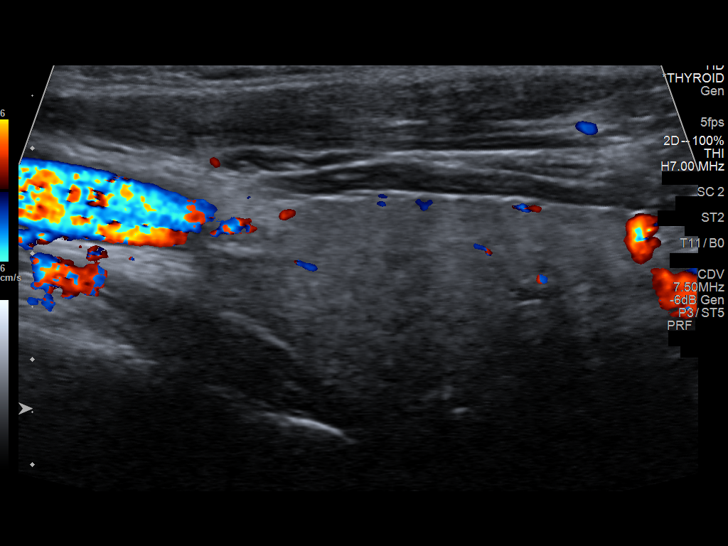
[im 12/27]
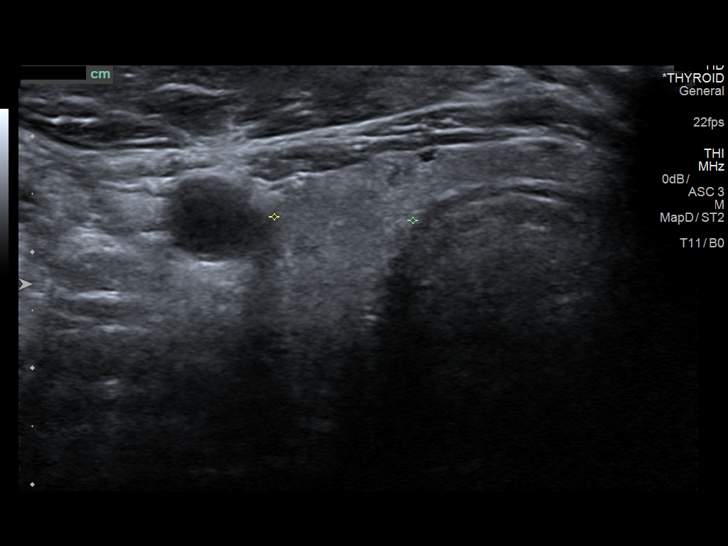
[im 15/27]
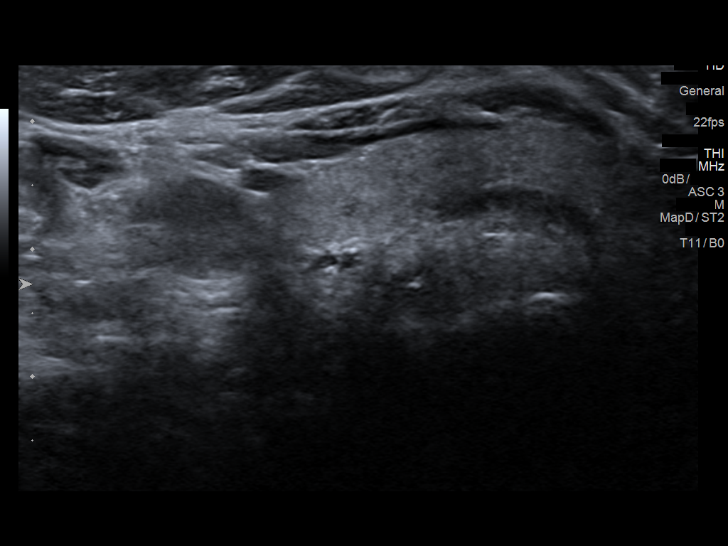
[im 17/27]
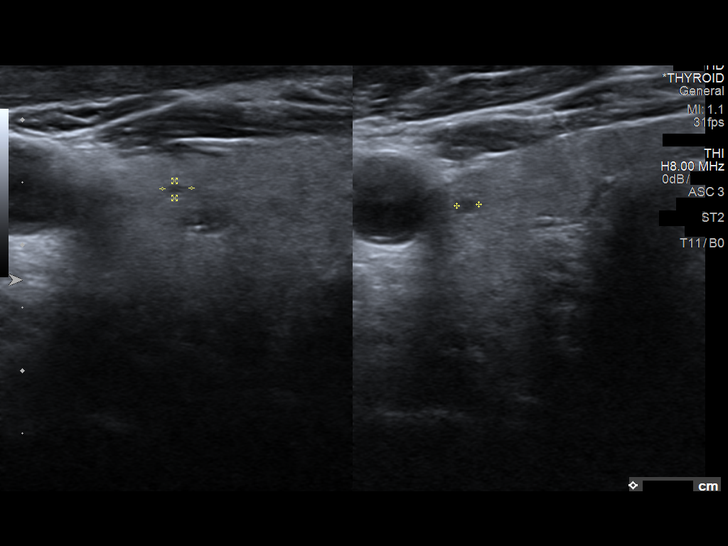
[im 18/27]
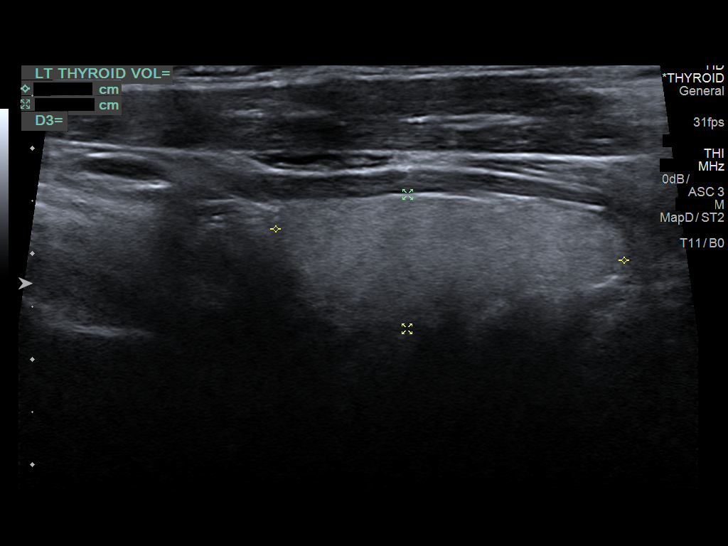
[im 20/27]
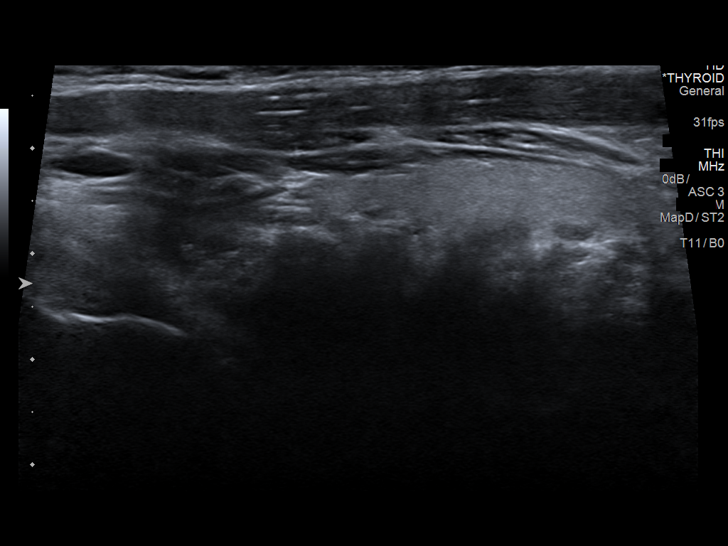
[im 22/27]
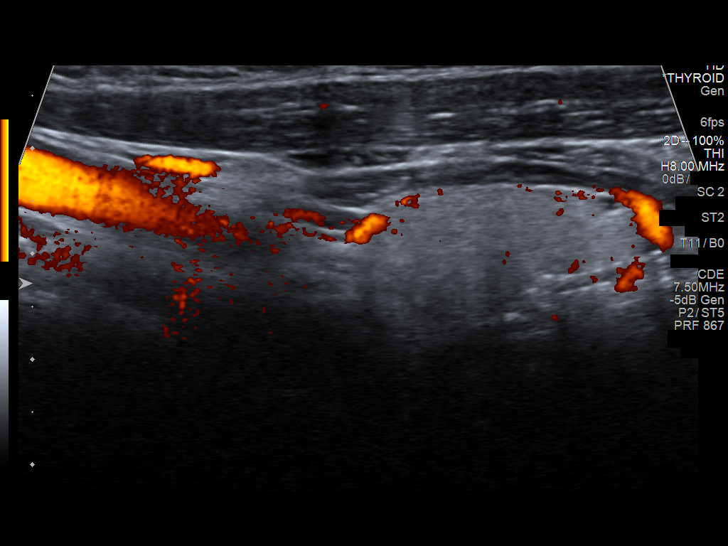
[im 24/27]
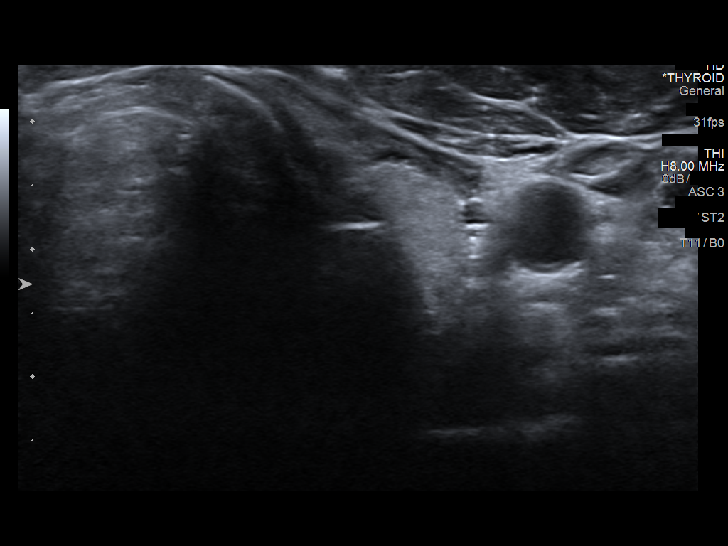
[im 27/27]
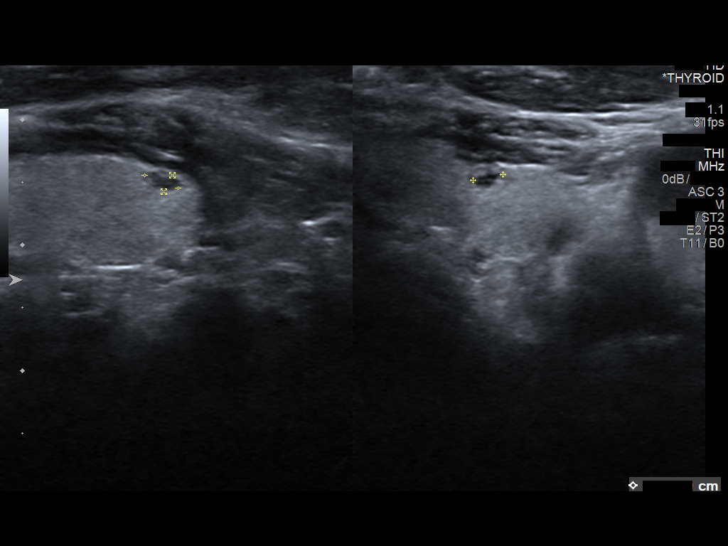

[14 of 25 positions shown; findings below may reference images not displayed]

FINDINGS: Parenchymal Echotexture: Mildly heterogenous

Estimated total number of nodules >/= 1 cm: 0

Number of spongiform nodules >/=  2 cm not described below (TR1): 0

Number of mixed cystic and solid nodules >/= 1.5 cm not described
below (TR2): 0

_________________________________________________________

Isthmus: 0.5 mm thickness

No discrete nodules are identified within the thyroid isthmus.

_________________________________________________________

Right lobe: 3.5 x 2.2 x 1.2 cm

At least 3 small colloid cysts, all less than 4 mm maximum diameter.
No nodule.

_________________________________________________________

Left lobe: 3.3 x 1.3 x 1.1 cm

There is a 3 mm colloid cyst, inferior pole.  No nodule.
IMPRESSION: 1. Normal-sized thyroid.  No evidence of nodule or mass.

The above is in keeping with the ACR TI-RADS recommendations - [HOSPITAL] 9103;[DATE].

## 2017-07-12 MED FILL — SULFAMETHOXAZOLE-TMP DS TAB: 800-160 | 9 days supply | Qty: 18 | Fill #0

## 2017-07-15 DIAGNOSIS — M65321 Trigger finger, right index finger: Secondary | ICD-10-CM | POA: Diagnosis not present

## 2017-07-15 DIAGNOSIS — M65331 Trigger finger, right middle finger: Secondary | ICD-10-CM | POA: Diagnosis not present

## 2017-07-28 DIAGNOSIS — M79641 Pain in right hand: Secondary | ICD-10-CM | POA: Diagnosis not present

## 2017-08-11 ENCOUNTER — Other Ambulatory Visit: Payer: Self-pay | Admitting: Internal Medicine

## 2017-08-11 MED ORDER — CIPROFLOXACIN HCL 500 MG PO TABS: 500.0000 mg | ORAL_TABLET | Freq: Two times a day (BID) | ORAL | 0 refills | Status: DC

## 2017-08-11 MED FILL — CIPROFLOXACIN HCL 500 MG TA: 500 | 3 days supply | Qty: 6 | Fill #0

## 2017-09-01 DIAGNOSIS — S90852A Superficial foreign body, left foot, initial encounter: Secondary | ICD-10-CM | POA: Diagnosis not present

## 2017-09-01 DIAGNOSIS — M79672 Pain in left foot: Secondary | ICD-10-CM | POA: Diagnosis not present

## 2017-09-19 DIAGNOSIS — L989 Disorder of the skin and subcutaneous tissue, unspecified: Secondary | ICD-10-CM | POA: Diagnosis not present

## 2017-09-19 DIAGNOSIS — S90852D Superficial foreign body, left foot, subsequent encounter: Secondary | ICD-10-CM | POA: Diagnosis not present

## 2017-09-19 DIAGNOSIS — M79672 Pain in left foot: Secondary | ICD-10-CM | POA: Diagnosis not present

## 2017-09-19 MED FILL — SILVADENE 1% CREAM: 1 | 20 days supply | Qty: 50 | Fill #0

## 2017-10-06 DIAGNOSIS — M79672 Pain in left foot: Secondary | ICD-10-CM | POA: Diagnosis not present

## 2017-10-06 DIAGNOSIS — S90852D Superficial foreign body, left foot, subsequent encounter: Secondary | ICD-10-CM | POA: Diagnosis not present

## 2017-12-14 DIAGNOSIS — D239 Other benign neoplasm of skin, unspecified: Secondary | ICD-10-CM | POA: Diagnosis not present

## 2017-12-14 DIAGNOSIS — D225 Melanocytic nevi of trunk: Secondary | ICD-10-CM | POA: Diagnosis not present

## 2017-12-14 DIAGNOSIS — D2271 Melanocytic nevi of right lower limb, including hip: Secondary | ICD-10-CM | POA: Diagnosis not present

## 2017-12-14 DIAGNOSIS — L821 Other seborrheic keratosis: Secondary | ICD-10-CM | POA: Diagnosis not present

## 2017-12-14 DIAGNOSIS — L814 Other melanin hyperpigmentation: Secondary | ICD-10-CM | POA: Diagnosis not present

## 2017-12-14 DIAGNOSIS — L570 Actinic keratosis: Secondary | ICD-10-CM | POA: Diagnosis not present

## 2017-12-14 DIAGNOSIS — Z23 Encounter for immunization: Secondary | ICD-10-CM | POA: Diagnosis not present

## 2017-12-21 ENCOUNTER — Ambulatory Visit (INDEPENDENT_AMBULATORY_CARE_PROVIDER_SITE_OTHER): Payer: 59 | Admitting: Internal Medicine

## 2017-12-21 ENCOUNTER — Encounter: Payer: Self-pay | Admitting: Internal Medicine

## 2017-12-21 VITALS — BP 116/62 | HR 48 | Temp 97.5°F | Resp 16 | Ht 68.0 in | Wt 160.5 lb

## 2017-12-21 DIAGNOSIS — R739 Hyperglycemia, unspecified: Secondary | ICD-10-CM

## 2017-12-21 DIAGNOSIS — Z23 Encounter for immunization: Secondary | ICD-10-CM | POA: Diagnosis not present

## 2017-12-21 DIAGNOSIS — Z0000 Encounter for general adult medical examination without abnormal findings: Secondary | ICD-10-CM

## 2017-12-21 LAB — BASIC METABOLIC PANEL
BUN: 17 mg/dL (ref 6–23)
CO2: 29 mEq/L (ref 19–32)
Calcium: 9.4 mg/dL (ref 8.4–10.5)
Chloride: 104 mEq/L (ref 96–112)
Creatinine, Ser: 0.95 mg/dL (ref 0.40–1.50)
GFR: 85.81 mL/min (ref >60.00)
Glucose, Bld: 98 mg/dL (ref 70–99)
Potassium: 4.4 mEq/L (ref 3.5–5.1)
Sodium: 140 mEq/L (ref 135–145)

## 2017-12-21 LAB — CBC WITH DIFFERENTIAL/PLATELET
Basophils Absolute: 0 10*3/uL (ref 0.0–0.1)
Basophils Relative: 1 % (ref 0.0–3.0)
Eosinophils Absolute: 0.1 10*3/uL (ref 0.0–0.7)
Eosinophils Relative: 3 % (ref 0.0–5.0)
HCT: 43.4 % (ref 39.0–52.0)
Hemoglobin: 14.5 g/dL (ref 13.0–17.0)
Lymphocytes Relative: 28.2 % (ref 12.0–46.0)
Lymphs Abs: 1.3 10*3/uL (ref 0.7–4.0)
MCHC: 33.6 g/dL (ref 30.0–36.0)
MCV: 88.5 fl (ref 78.0–100.0)
Monocytes Absolute: 0.6 10*3/uL (ref 0.1–1.0)
Monocytes Relative: 13.6 % — ABNORMAL HIGH (ref 3.0–12.0)
Neutro Abs: 2.6 10*3/uL (ref 1.4–7.7)
Neutrophils Relative %: 54.2 % (ref 43.0–77.0)
Platelets: 181 10*3/uL (ref 150.0–400.0)
RBC: 4.9 Mil/uL (ref 4.22–5.81)
RDW: 13.8 % (ref 11.5–15.5)
WBC: 4.7 10*3/uL (ref 4.0–10.5)

## 2017-12-21 LAB — LIPID PANEL
Cholesterol: 162 mg/dL (ref 0–200)
HDL: 45.3 mg/dL (ref >39.00)
LDL Cholesterol: 104 mg/dL — ABNORMAL HIGH (ref 0–99)
NonHDL: 117.18
Total CHOL/HDL Ratio: 4
Triglycerides: 68 mg/dL (ref 0.0–149.0)
VLDL: 13.6 mg/dL (ref 0.0–40.0)

## 2017-12-21 LAB — HEMOGLOBIN A1C: Hgb A1c MFr Bld: 5.7 % (ref 4.6–6.5)

## 2017-12-21 NOTE — Assessment & Plan Note (Addendum)
Td 2009, booster at work? Pt will check; flu shot at work, shingrex #1 today, next 2 mohths --CCS: Dr Perry--nml--next 2024  -Prostate cancer screening: DRE - PSA wnl 2018 -Cont healthy life style, play hockey x 2/week, gym 2-3/w -Labs: BMP, FLP, CBC, A1c

## 2017-12-21 NOTE — Progress Notes (Signed)
Pre visit review using our clinic review tool, if applicable. No additional management support is needed unless otherwise documented below in the visit note. 

## 2017-12-21 NOTE — Progress Notes (Signed)
Subjective:    Patient ID: Jorge Humphrey, male    DOB: 1955-08-10, 62 y.o.   MRN: [id number]  DOS:  12/21/2017 Type of visit - description : cpx Interval history: No concerns.  Feeling well.  He remains extremely active.   Review of Systems Had URI 2 days ago, no fever chills, feeling better already.  Other than above, a 14 point review of systems is negative    Past Medical History:  Diagnosis Date  . PPD positive    h/o  . RBBB     Past Surgical History:  Procedure Laterality Date  . LIPOMA EXCISION     R leg   . TONSILLECTOMY    . TRIGGER FINGER RELEASE Right 2019   two finger, local anesthesia, Dr Amedeo Plenty  . VASECTOMY      Social History   Socioeconomic History  . Marital status: Married    Spouse name: Not on file  . Number of children: 3  . Years of education: Not on file  . Highest education level: Not on file  Occupational History  . Occupation: Programmer, multimedia: [employer]  . Financial resource strain: Not on file  . Food insecurity:    Worry: Not on file    Inability: Not on file  . Transportation needs:    Medical: Not on file    Non-medical: Not on file  Tobacco Use  . Smoking status: Former Smoker    Last attempt to quit: 03/07/1978    Years since quitting: 39.8  . Smokeless tobacco: Never Used  Substance and Sexual Activity  . Alcohol use: Yes    Comment: drinks 2 beers a month  . Drug use: No  . Sexual activity: Not on file  Lifestyle  . Physical activity:    Days per week: Not on file    Minutes per session: Not on file  . Stress: Not on file  Relationships  . Social connections:    Talks on phone: Not on file    Gets together: Not on file    Attends religious service: Not on file    Active member of club or organization: Not on file    Attends meetings of clubs or organizations: Not on file    Relationship status: Not on file  . Intimate partner violence:    Fear of current or ex partner: Not on file   Emotionally abused: Not on file    Physically abused: Not on file    Forced sexual activity: Not on file  Other Topics Concern  . Not on file  Social History Narrative   Patient is from [place], [place] from [place].   Patient is a Marine scientist,  Works at  [employer]      Family History  Problem Relation Age of Onset  . Depression Daughter   . Migraines Daughter   . Non-Hodgkin's lymphoma Father   . Melanoma Father   . Heart block Unknown        pacemaker-cousin  . Prostate cancer Other        cousin at age 85  . Cancer Brother        glioblastoma  . Colon cancer Neg Hx   . Diabetes Neg Hx   . CAD Neg Hx      Allergies as of 12/21/2017   No Known Allergies     Medication List        Accurate as of  12/21/17  6:48 PM. Always use your most recent med list.          ALPRAZolam 0.5 MG tablet Commonly known as:  XANAX Take 1 tablet (0.5 mg total) by mouth at bedtime as needed for anxiety.   cetirizine 10 MG tablet Commonly known as:  ZYRTEC Take 10 mg by mouth daily as needed for allergies.   fluticasone 50 MCG/ACT nasal spray Commonly known as:  FLONASE Place 2 sprays into both nostrils daily.   loratadine 10 MG tablet Commonly known as:  CLARITIN Take 10 mg by mouth daily as needed.          Objective:   Physical Exam BP 116/62 (BP Location: Right Arm, Patient Position: Sitting, Cuff Size: Normal)   Pulse (!) 48   Temp (!) 97.5 F (36.4 C) (Oral)   Resp 16   Ht 5\' 8"  (1.727 m)   Wt 160 lb 8 oz (72.8 kg)   SpO2 98%   BMI 24.40 kg/m  General: Well developed, NAD, see BMI.  Neck: No  thyromegaly  HEENT:  Normocephalic . Face symmetric, atraumatic Lungs:  CTA B Normal respiratory effort, no intercostal retractions, no accessory muscle use. Heart: RRR,  no murmur.  No pretibial edema bilaterally  Abdomen:  Not distended, soft, non-tender. No rebound or rigidity.   Skin: Exposed areas without rash. Not pale. Not jaundice Neurologic:    alert & oriented X3.  Speech normal, gait appropriate for age and unassisted Strength symmetric and appropriate for age.  Psych: Cognition and judgment appear intact.  Cooperative with normal attention span and concentration.  Behavior appropriate. No anxious or depressed appearing.     Assessment & Plan:   Assessment A1C 6.0 (2009)  History + PPD, s/p treatment +FH melanoma: sees derm q year Bradycardia: Chronic, asymptomatic  PLAN: Hyperglycemia: Check a A1c Family history of melanoma: Sees dermatology regularly. Insomnia: takes Xanax very infrequently  RTC 1 year.

## 2017-12-21 NOTE — Patient Instructions (Signed)
GO TO THE LAB : Get the blood work     GO TO THE FRONT DESK Schedule your next appointment for a  Physical exam in 1 year  Nurse visit in 2 months for your [facility]

## 2017-12-21 NOTE — Assessment & Plan Note (Signed)
Hyperglycemia: Check a A1c Family history of melanoma: Sees dermatology regularly. Insomnia: takes Xanax very infrequently  RTC 1 year.

## 2018-02-22 ENCOUNTER — Ambulatory Visit (INDEPENDENT_AMBULATORY_CARE_PROVIDER_SITE_OTHER): Payer: 59

## 2018-02-22 DIAGNOSIS — Z23 Encounter for immunization: Secondary | ICD-10-CM | POA: Diagnosis not present

## 2018-05-23 ENCOUNTER — Encounter: Payer: Self-pay | Admitting: Internal Medicine

## 2018-05-23 ENCOUNTER — Ambulatory Visit (INDEPENDENT_AMBULATORY_CARE_PROVIDER_SITE_OTHER): Payer: 59 | Admitting: Internal Medicine

## 2018-05-23 ENCOUNTER — Ambulatory Visit: Payer: 59 | Admitting: Internal Medicine

## 2018-05-23 ENCOUNTER — Other Ambulatory Visit: Payer: Self-pay

## 2018-05-23 VITALS — BP 118/74 | HR 79 | Temp 97.9°F | Resp 16 | Ht 68.0 in | Wt 161.0 lb

## 2018-05-23 DIAGNOSIS — J069 Acute upper respiratory infection, unspecified: Secondary | ICD-10-CM

## 2018-05-23 MED ORDER — AZELASTINE HCL 0.1 % NA SOLN: 2.0000 | Freq: Every evening | NASAL | 3 refills | Status: DC | PRN

## 2018-05-23 MED ORDER — CIPROFLOXACIN HCL 0.3 % OP SOLN: 1.0000 [drp] | OPHTHALMIC | 0 refills | Status: DC

## 2018-05-23 MED ORDER — AZITHROMYCIN 250 MG PO TABS: ORAL_TABLET | ORAL | 0 refills | Status: DC

## 2018-05-23 MED FILL — CIPROFLOXACIN HCL 0.3 % SOL: 0.3 | 5 days supply | Qty: 5 | Fill #0

## 2018-05-23 MED FILL — AZELASTINE HCL 137 MCG SPRY: 0.1 | 50 days supply | Qty: 30 | Fill #0

## 2018-05-23 MED FILL — AZITHROMYCIN 250 MG TABLET: 250 | 5 days supply | Qty: 6 | Fill #0

## 2018-05-23 NOTE — Patient Instructions (Addendum)
Rest, fluids , tylenol  For cough:  Take Mucinex DM twice a day as needed until better  For nasal congestion: Use OTC   Flonase : 2 nasal sprays on each side of the nose in the morning until you feel better Use ASTELIN a prescribed spray : 2 nasal sprays on each side of the nose at night until you feel better   Use the eye drops for 5 days Cold compresses and articial tears   Take the antibiotic as prescribed - zithromax only if needed in 4-5 days    Call if not gradually better over the next  5 days  Call anytime if the symptoms are severe

## 2018-05-23 NOTE — Progress Notes (Signed)
Pre visit review using our clinic review tool, if applicable. No additional management support is needed unless otherwise documented below in the visit note. 

## 2018-05-23 NOTE — Progress Notes (Signed)
Subjective:    Patient ID: Jorge Humphrey, male    DOB: 04-02-1955, 63 y.o.   MRN: [id number]  DOS:  05/23/2018 Type of visit - description: acute Symptoms started 6 days ago with a mild sore throat associated with postnasal dripping and cough. Patient thinks that the cough is purely related to the PND.  He denies fever, chest pain, difficulty breathing. He did go to [place] twice, the first time was 6 weeks ago and then 3 days ago.  He went near [place] to visit family.  He drove, was not on any airport.  His wife is not having any respiratory symptoms, she did have some GI problems.   Review of Systems Denies any unusual myalgias, no rash, no wheezing. He had some headache 3 days ago and that is largely resolved. Developed bilateral eye redness about 24 hours ago, + dry discharge noted in both eyes in the morning. No photophobia.  Past Medical History:  Diagnosis Date  . PPD positive    h/o  . RBBB     Past Surgical History:  Procedure Laterality Date  . LIPOMA EXCISION     R leg   . TONSILLECTOMY    . TRIGGER FINGER RELEASE Right 2019   two finger, local anesthesia, Dr Amedeo Plenty  . VASECTOMY      Social History   Socioeconomic History  . Marital status: Married    Spouse name: Not on file  . Number of children: 3  . Years of education: Not on file  . Highest education level: Not on file  Occupational History  . Occupation: Programmer, multimedia: [employer]  . Financial resource strain: Not on file  . Food insecurity:    Worry: Not on file    Inability: Not on file  . Transportation needs:    Medical: Not on file    Non-medical: Not on file  Tobacco Use  . Smoking status: Former Smoker    Last attempt to quit: 03/07/1978    Years since quitting: 40.2  . Smokeless tobacco: Never Used  Substance and Sexual Activity  . Alcohol use: Yes    Comment: drinks 2 beers a month  . Drug use: No  . Sexual activity: Not on file  Lifestyle  . Physical  activity:    Days per week: Not on file    Minutes per session: Not on file  . Stress: Not on file  Relationships  . Social connections:    Talks on phone: Not on file    Gets together: Not on file    Attends religious service: Not on file    Active member of club or organization: Not on file    Attends meetings of clubs or organizations: Not on file    Relationship status: Not on file  . Intimate partner violence:    Fear of current or ex partner: Not on file    Emotionally abused: Not on file    Physically abused: Not on file    Forced sexual activity: Not on file  Other Topics Concern  . Not on file  Social History Narrative   Patient is from [place], [place] from [place].   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 Always use your most recent med list.        ALPRAZolam 0.5 MG tablet Commonly known as:  Xanax Take 1 tablet (0.5 mg total) by mouth at bedtime as needed for anxiety.   cetirizine 10 MG tablet Commonly known as:  ZYRTEC Take 10 mg by mouth daily as needed for allergies.   fluticasone 50 MCG/ACT nasal spray Commonly known as:  Flonase Place 2 sprays into both nostrils daily.   loratadine 10 MG tablet Commonly known as:  CLARITIN Take 10 mg by mouth daily as needed.           Objective:   Physical Exam BP 118/74 (BP Location: Left Arm, Patient Position: Sitting, Cuff Size: Small)   Pulse 79   Temp 97.9 F (36.6 C) (Oral)   Resp 16   Ht 5\' 8"  (1.727 m)   Wt 161 lb (73 kg)   SpO2 97%   BMI 24.48 kg/m  General:   Well developed, NAD, BMI noted. HEENT:  Normocephalic . Face symmetric, atraumatic. TMs normal. Nose congested, sinuses no TTP. Throat symmetric, no red. EOMI, pupils equal and reactive, anterior chambers normal.  Conjunctivas are quite injected and I see  discharge, worse on the left. Lungs:   CTA B Normal respiratory effort, no intercostal retractions, no accessory muscle use. Heart: RRR,  no murmur.  No pretibial edema bilaterally  Skin: Not pale. Not jaundice Neurologic:  alert & oriented X3.  Speech normal, gait appropriate for age and unassisted Psych--  Cognition and judgment appear intact.  Cooperative with normal attention span and concentration.  Behavior appropriate. No anxious or depressed appearing.      Assessment     Assessment A1C 6.0 (2009)  History + PPD, s/p treatment +FH melanoma: sees derm q year Bradycardia: Chronic, asymptomatic  PLAN: URI:  Symptoms consistent with URI with quite noticeable conjunctivitis. We talk about a coronavirus outbreak and currently he does not meet criteria for testing despite being in [place] recently.   Recommend supportive treatment with fluids, Tylenol, Mucinex DM, Flonase, Astelin. We will prescribe eyedrops given the severity of the conjunctivitis although is probably part of the URI. Also a paper prescription for Zithromax in case he is not better. Declined a prescription for hydrocodone cough suppressant.

## 2018-05-24 NOTE — Assessment & Plan Note (Signed)
URI:  Symptoms consistent with URI with quite noticeable conjunctivitis. We talk about a coronavirus outbreak and currently he does not meet criteria for testing despite being in [place] recently.   Recommend supportive treatment with fluids, Tylenol, Mucinex DM, Flonase, Astelin. We will prescribe eyedrops given the severity of the conjunctivitis although is probably part of the URI. Also a paper prescription for Zithromax in case he is not better. Declined a prescription for hydrocodone cough suppressant.

## 2018-11-22 DIAGNOSIS — S8252XA Displaced fracture of medial malleolus of left tibia, initial encounter for closed fracture: Secondary | ICD-10-CM | POA: Diagnosis not present

## 2018-11-22 DIAGNOSIS — W19XXXA Unspecified fall, initial encounter: Secondary | ICD-10-CM | POA: Diagnosis not present

## 2018-11-23 DIAGNOSIS — S8252XA Displaced fracture of medial malleolus of left tibia, initial encounter for closed fracture: Secondary | ICD-10-CM | POA: Diagnosis not present

## 2018-11-28 ENCOUNTER — Other Ambulatory Visit (HOSPITAL_COMMUNITY)
Admission: RE | Admit: 2018-11-28 | Discharge: 2018-11-28 | Disposition: A | Discharge status: Home / Self Care | Payer: 59 | Source: Ambulatory Visit | Attending: Orthopedic Surgery | Admitting: Orthopedic Surgery

## 2018-11-28 ENCOUNTER — Encounter (HOSPITAL_BASED_OUTPATIENT_CLINIC_OR_DEPARTMENT_OTHER): Payer: Self-pay | Admitting: *Deleted

## 2018-11-28 ENCOUNTER — Other Ambulatory Visit: Payer: Self-pay

## 2018-11-28 ENCOUNTER — Other Ambulatory Visit (HOSPITAL_COMMUNITY): Payer: Self-pay | Admitting: Orthopedic Surgery

## 2018-11-28 DIAGNOSIS — Z20828 Contact with and (suspected) exposure to other viral communicable diseases: Secondary | ICD-10-CM | POA: Insufficient documentation

## 2018-11-28 DIAGNOSIS — Z01812 Encounter for preprocedural laboratory examination: Secondary | ICD-10-CM | POA: Diagnosis not present

## 2018-11-28 DIAGNOSIS — M25572 Pain in left ankle and joints of left foot: Secondary | ICD-10-CM | POA: Diagnosis not present

## 2018-11-28 DIAGNOSIS — S8252XA Displaced fracture of medial malleolus of left tibia, initial encounter for closed fracture: Secondary | ICD-10-CM | POA: Diagnosis not present

## 2018-11-28 LAB — SARS CORONAVIRUS 2 (TAT 6-24 HRS): SARS Coronavirus 2: NEGATIVE

## 2018-11-29 ENCOUNTER — Ambulatory Visit (HOSPITAL_BASED_OUTPATIENT_CLINIC_OR_DEPARTMENT_OTHER): Payer: 59 | Admitting: Certified Registered"

## 2018-11-29 ENCOUNTER — Encounter (HOSPITAL_BASED_OUTPATIENT_CLINIC_OR_DEPARTMENT_OTHER)
Admission: RE | Disposition: A | Discharge status: Home / Self Care | Payer: Self-pay | Source: Home / Self Care | Attending: Orthopedic Surgery

## 2018-11-29 ENCOUNTER — Ambulatory Visit (HOSPITAL_BASED_OUTPATIENT_CLINIC_OR_DEPARTMENT_OTHER)
Admission: RE | Admit: 2018-11-29 | Discharge: 2018-11-29 | Disposition: A | Discharge status: Home / Self Care | Payer: 59 | Attending: Orthopedic Surgery | Admitting: Orthopedic Surgery

## 2018-11-29 ENCOUNTER — Encounter (HOSPITAL_BASED_OUTPATIENT_CLINIC_OR_DEPARTMENT_OTHER): Payer: Self-pay | Admitting: Certified Registered"

## 2018-11-29 ENCOUNTER — Other Ambulatory Visit: Payer: Self-pay

## 2018-11-29 DIAGNOSIS — M25572 Pain in left ankle and joints of left foot: Secondary | ICD-10-CM | POA: Diagnosis present

## 2018-11-29 DIAGNOSIS — J309 Allergic rhinitis, unspecified: Secondary | ICD-10-CM | POA: Diagnosis not present

## 2018-11-29 DIAGNOSIS — S8252XA Displaced fracture of medial malleolus of left tibia, initial encounter for closed fracture: Secondary | ICD-10-CM | POA: Diagnosis not present

## 2018-11-29 DIAGNOSIS — Z760 Encounter for issue of repeat prescription: Secondary | ICD-10-CM | POA: Diagnosis not present

## 2018-11-29 DIAGNOSIS — Z87891 Personal history of nicotine dependence: Secondary | ICD-10-CM | POA: Insufficient documentation

## 2018-11-29 DIAGNOSIS — X501XXA Overexertion from prolonged static or awkward postures, initial encounter: Secondary | ICD-10-CM | POA: Diagnosis not present

## 2018-11-29 HISTORY — PX: ORIF ANKLE FRACTURE: SHX5408

## 2018-11-29 HISTORY — DX: Displaced fracture of medial malleolus of left tibia, subsequent encounter for closed fracture with routine healing: S82.52XD

## 2018-11-29 SURGERY — OPEN REDUCTION INTERNAL FIXATION (ORIF) ANKLE FRACTURE
Anesthesia: Monitor Anesthesia Care | Site: Ankle | Laterality: Left

## 2018-11-29 MED ORDER — KETOROLAC TROMETHAMINE 30 MG/ML IJ SOLN
INTRAMUSCULAR | Status: AC
  Filled 2018-11-29: qty 1

## 2018-11-29 MED ORDER — ACETAMINOPHEN 10 MG/ML IV SOLN
1000.0000 mg | Freq: Once | INTRAVENOUS | Status: AC
  Administered 2018-11-29: 1000 mg via INTRAVENOUS

## 2018-11-29 MED ORDER — PROPOFOL 500 MG/50ML IV EMUL
INTRAVENOUS | Status: DC | PRN
  Administered 2018-11-29: 100 ug/kg/min via INTRAVENOUS

## 2018-11-29 MED ORDER — HYDROCODONE-ACETAMINOPHEN 5-325 MG PO TABS: 1.0000 | ORAL_TABLET | Freq: Four times a day (QID) | ORAL | 0 refills | Status: DC | PRN

## 2018-11-29 MED ORDER — MEPERIDINE HCL 25 MG/ML IJ SOLN: 6.2500 mg | INTRAMUSCULAR | Status: DC | PRN

## 2018-11-29 MED ORDER — CHLORHEXIDINE GLUCONATE 4 % EX LIQD: 60.0000 mL | Freq: Once | CUTANEOUS | Status: DC

## 2018-11-29 MED ORDER — CEFAZOLIN SODIUM-DEXTROSE 2-4 GM/100ML-% IV SOLN
INTRAVENOUS | Status: AC
  Filled 2018-11-29: qty 100

## 2018-11-29 MED ORDER — ONDANSETRON HCL 4 MG/2ML IJ SOLN
INTRAMUSCULAR | Status: DC | PRN
  Administered 2018-11-29: 4 mg via INTRAVENOUS

## 2018-11-29 MED ORDER — KETOROLAC TROMETHAMINE 30 MG/ML IJ SOLN
30.0000 mg | Freq: Once | INTRAMUSCULAR | Status: AC
  Administered 2018-11-29: 30 mg via INTRAVENOUS

## 2018-11-29 MED ORDER — LIDOCAINE HCL (CARDIAC) PF 100 MG/5ML IV SOSY
PREFILLED_SYRINGE | INTRAVENOUS | Status: DC | PRN
  Administered 2018-11-29: 30 mg via INTRAVENOUS

## 2018-11-29 MED ORDER — FENTANYL CITRATE (PF) 100 MCG/2ML IJ SOLN
INTRAMUSCULAR | Status: AC
  Filled 2018-11-29: qty 2

## 2018-11-29 MED ORDER — OXYCODONE HCL 5 MG/5ML PO SOLN: 5.0000 mg | Freq: Once | ORAL | Status: DC | PRN

## 2018-11-29 MED ORDER — SCOPOLAMINE 1 MG/3DAYS TD PT72: 1.0000 | MEDICATED_PATCH | Freq: Once | TRANSDERMAL | Status: DC

## 2018-11-29 MED ORDER — PROMETHAZINE HCL 25 MG/ML IJ SOLN: 6.2500 mg | INTRAMUSCULAR | Status: DC | PRN

## 2018-11-29 MED ORDER — BUPIVACAINE-EPINEPHRINE (PF) 0.5% -1:200000 IJ SOLN
INTRAMUSCULAR | Status: DC | PRN
  Administered 2018-11-29: 30 mL via PERINEURAL

## 2018-11-29 MED ORDER — FENTANYL CITRATE (PF) 100 MCG/2ML IJ SOLN
50.0000 ug | INTRAMUSCULAR | Status: DC | PRN
  Administered 2018-11-29: 100 ug via INTRAVENOUS

## 2018-11-29 MED ORDER — SODIUM CHLORIDE 0.9 % IV SOLN: INTRAVENOUS | Status: DC

## 2018-11-29 MED ORDER — ROPIVACAINE HCL 7.5 MG/ML IJ SOLN
INTRAMUSCULAR | Status: DC | PRN
  Administered 2018-11-29: 20 mL via PERINEURAL

## 2018-11-29 MED ORDER — ACETAMINOPHEN 10 MG/ML IV SOLN
INTRAVENOUS | Status: AC
  Filled 2018-11-29: qty 100

## 2018-11-29 MED ORDER — OXYCODONE HCL 5 MG PO TABS: 5.0000 mg | ORAL_TABLET | Freq: Once | ORAL | Status: DC | PRN

## 2018-11-29 MED ORDER — CEFAZOLIN SODIUM-DEXTROSE 2-4 GM/100ML-% IV SOLN
2.0000 g | INTRAVENOUS | Status: AC
  Administered 2018-11-29: 2 g via INTRAVENOUS

## 2018-11-29 MED ORDER — MIDAZOLAM HCL 2 MG/2ML IJ SOLN: 1.0000 mg | INTRAMUSCULAR | Status: DC | PRN

## 2018-11-29 MED ORDER — FENTANYL CITRATE (PF) 100 MCG/2ML IJ SOLN
25.0000 ug | INTRAMUSCULAR | Status: DC | PRN
  Administered 2018-11-29: 50 ug via INTRAVENOUS

## 2018-11-29 MED ORDER — MIDAZOLAM HCL 2 MG/2ML IJ SOLN
INTRAMUSCULAR | Status: AC
  Filled 2018-11-29: qty 2

## 2018-11-29 MED ORDER — LACTATED RINGERS IV SOLN
INTRAVENOUS | Status: DC
  Administered 2018-11-29: 12:00:00 via INTRAVENOUS

## 2018-11-29 MED FILL — HYDROCODON-APAP 5-325: 5-325 | 2 days supply | Qty: 5 | Fill #0

## 2018-11-29 SURGICAL SUPPLY — 71 items
APL PRP STRL LF DISP 70% ISPRP (MISCELLANEOUS) ×1
BANDAGE ESMARK 6X9 LF (GAUZE/BANDAGES/DRESSINGS) ×1 IMPLANT
BIT DRILL 2.9 CANN QC NONSTRL (BIT) ×1 IMPLANT
BLADE SURG 15 STRL LF DISP TIS (BLADE) ×2 IMPLANT
BLADE SURG 15 STRL SS (BLADE) ×4
BNDG CMPR 9X4 STRL LF SNTH (GAUZE/BANDAGES/DRESSINGS)
BNDG CMPR 9X6 STRL LF SNTH (GAUZE/BANDAGES/DRESSINGS)
BNDG COHESIVE 4X5 TAN STRL (GAUZE/BANDAGES/DRESSINGS) ×2 IMPLANT
BNDG COHESIVE 6X5 TAN STRL LF (GAUZE/BANDAGES/DRESSINGS) ×2 IMPLANT
BNDG ESMARK 4X9 LF (GAUZE/BANDAGES/DRESSINGS) IMPLANT
BNDG ESMARK 6X9 LF (GAUZE/BANDAGES/DRESSINGS)
CANISTER SUCT 1200ML W/VALVE (MISCELLANEOUS) ×2 IMPLANT
CHLORAPREP W/TINT 26 (MISCELLANEOUS) ×2 IMPLANT
COVER BACK TABLE REUSABLE LG (DRAPES) ×2 IMPLANT
COVER WAND RF STERILE (DRAPES) IMPLANT
CUFF TOURN SGL QUICK 24 (TOURNIQUET CUFF) ×2
CUFF TOURN SGL QUICK 34 (TOURNIQUET CUFF)
CUFF TRNQT CYL 24X4X16.5-23 (TOURNIQUET CUFF) IMPLANT
CUFF TRNQT CYL 34X4.125X (TOURNIQUET CUFF) IMPLANT
DECANTER SPIKE VIAL GLASS SM (MISCELLANEOUS) IMPLANT
DRAPE EXTREMITY T 121X128X90 (DISPOSABLE) ×2 IMPLANT
DRAPE HALF SHEET 70X43 (DRAPES) ×2 IMPLANT
DRAPE OEC MINIVIEW 54X84 (DRAPES) ×2 IMPLANT
DRAPE U-SHAPE 47X51 STRL (DRAPES) ×2 IMPLANT
DRSG MEPITEL 4X7.2 (GAUZE/BANDAGES/DRESSINGS) ×2 IMPLANT
DRSG PAD ABDOMINAL 8X10 ST (GAUZE/BANDAGES/DRESSINGS) ×4 IMPLANT
ELECT REM PT RETURN 9FT ADLT (ELECTROSURGICAL) ×2
ELECTRODE REM PT RTRN 9FT ADLT (ELECTROSURGICAL) ×1 IMPLANT
GAUZE SPONGE 4X4 12PLY STRL (GAUZE/BANDAGES/DRESSINGS) ×2 IMPLANT
GLOVE BIO SURGEON STRL SZ8 (GLOVE) ×2 IMPLANT
GLOVE BIOGEL PI IND STRL 8 (GLOVE) ×2 IMPLANT
GLOVE BIOGEL PI INDICATOR 8 (GLOVE) ×2
GLOVE ECLIPSE 8.0 STRL XLNG CF (GLOVE) ×2 IMPLANT
GOWN STRL REUS W/ TWL LRG LVL3 (GOWN DISPOSABLE) ×1 IMPLANT
GOWN STRL REUS W/ TWL XL LVL3 (GOWN DISPOSABLE) ×2 IMPLANT
GOWN STRL REUS W/TWL LRG LVL3 (GOWN DISPOSABLE) ×2
GOWN STRL REUS W/TWL XL LVL3 (GOWN DISPOSABLE) ×4
K-WIRE ACE 1.6X6 (WIRE) ×4
KWIRE ACE 1.6X6 (WIRE) IMPLANT
NEEDLE HYPO 22GX1.5 SAFETY (NEEDLE) IMPLANT
NS IRRIG 1000ML POUR BTL (IV SOLUTION) ×2 IMPLANT
PACK BASIN DAY SURGERY FS (CUSTOM PROCEDURE TRAY) ×2 IMPLANT
PAD CAST 4YDX4 CTTN HI CHSV (CAST SUPPLIES) ×1 IMPLANT
PADDING CAST ABS 4INX4YD NS (CAST SUPPLIES)
PADDING CAST ABS COTTON 4X4 ST (CAST SUPPLIES) IMPLANT
PADDING CAST COTTON 4X4 STRL (CAST SUPPLIES) ×2
PADDING CAST COTTON 6X4 STRL (CAST SUPPLIES) ×2 IMPLANT
PENCIL BUTTON HOLSTER BLD 10FT (ELECTRODE) ×2 IMPLANT
SANITIZER HAND PURELL 535ML FO (MISCELLANEOUS) ×2 IMPLANT
SCREW LAG  RD HEAD 4.0 40 LTH (Screw) ×2 IMPLANT
SCREW LAG RD HEAD 4.0 40 LTH (Screw) IMPLANT
SLEEVE SCD COMPRESS KNEE MED (MISCELLANEOUS) ×2 IMPLANT
SPLINT FAST PLASTER 5X30 (CAST SUPPLIES) ×20
SPLINT PLASTER CAST FAST 5X30 (CAST SUPPLIES) ×20 IMPLANT
SPONGE LAP 18X18 RF (DISPOSABLE) ×2 IMPLANT
STOCKINETTE 6  STRL (DRAPES) ×1
STOCKINETTE 6 STRL (DRAPES) ×1 IMPLANT
SUCTION FRAZIER HANDLE 10FR (MISCELLANEOUS) ×1
SUCTION TUBE FRAZIER 10FR DISP (MISCELLANEOUS) ×1 IMPLANT
SUT ETHILON 3 0 PS 1 (SUTURE) ×2 IMPLANT
SUT FIBERWIRE #2 38 T-5 BLUE (SUTURE)
SUT MNCRL AB 3-0 PS2 18 (SUTURE) IMPLANT
SUT VIC AB 0 SH 27 (SUTURE) IMPLANT
SUT VIC AB 2-0 SH 27 (SUTURE) ×2
SUT VIC AB 2-0 SH 27XBRD (SUTURE) ×1 IMPLANT
SUTURE FIBERWR #2 38 T-5 BLUE (SUTURE) IMPLANT
SYR BULB 3OZ (MISCELLANEOUS) ×2 IMPLANT
SYR CONTROL 10ML LL (SYRINGE) IMPLANT
TOWEL GREEN STERILE FF (TOWEL DISPOSABLE) ×4 IMPLANT
TUBE CONNECTING 20X1/4 (TUBING) ×2 IMPLANT
UNDERPAD 30X36 HEAVY ABSORB (UNDERPADS AND DIAPERS) ×2 IMPLANT

## 2018-11-29 NOTE — Transfer of Care (Signed)
Immediate Anesthesia Transfer of Care Note  Patient: Jorge Humphrey  Procedure(s) Performed: Open reduction internal fixation left medial malleolus fracture (Left Ankle)  Patient Location: PACU  Anesthesia Type:MAC combined with regional for post-op pain  Level of Consciousness: awake, alert , oriented and patient cooperative  Airway & Oxygen Therapy: Patient Spontanous Breathing and Patient connected to face mask oxygen  Post-op Assessment: Report given to RN and Post -op Vital signs reviewed and stable  Post vital signs: Reviewed and stable  Last Vitals:  Vitals Value Taken Time  BP    Temp    Pulse 65 11/29/18 1407  Resp    SpO2 100 % 11/29/18 1407  Vitals shown include unvalidated device data.  Last Pain:  Vitals:   11/29/18 1215  TempSrc: Oral  PainSc: 0-No pain         Complications: No apparent anesthesia complications

## 2018-11-29 NOTE — Anesthesia Procedure Notes (Addendum)
Anesthesia Regional Block: Popliteal block   Pre-Anesthetic Checklist: ,, timeout performed, Correct Patient, Correct Site, Correct Laterality, Correct Procedure, Correct Position, site marked, Risks and benefits discussed,  Surgical consent,  Pre-op evaluation,  At surgeon's request and post-op pain management  Laterality: Left  Prep: chloraprep       Needles:  Injection technique: Single-shot  Needle Type: Echogenic Stimulator Needle     Needle Length: 9cm  Needle Gauge: 21   Needle insertion depth: 6 cm   Additional Needles:   Procedures:,,,, ultrasound used (permanent image in chart),,,,  Narrative:  Start time: 11/29/2018 12:45 PM End time: 11/29/2018 12:49 PM Injection made incrementally with aspirations every 5 mL.  Performed by: Personally  Anesthesiologist: Josephine Igo, MD  Additional Notes: Timeout performed. Patient sedated. Relevant anatomy ID'd using [place]. Incremental 2-12ml injection of LA with frequent aspiration. Patient tolerated procedure well.        Left Popliteal Block

## 2018-11-29 NOTE — Op Note (Signed)
11/29/2018  2:11 PM  PATIENT:  [place]  63 y.o. male  PRE-OPERATIVE DIAGNOSIS:  Left ankle medial malleolus fracture  POST-OPERATIVE DIAGNOSIS:  same  Procedure(s): 1.  Open treatment of left ankle medial malleolus fracture with internal fixation   2.  Stress examination of left ankle under fluoroscopy   3.  AP, mortise and lateral radiographs of the left ankle  SURGEON:  Wylene Simmer, MD  ASSISTANT: None  ANESTHESIA:   MAC, regional  EBL:  minimal   TOURNIQUET:   Total Tourniquet Time Documented: Thigh (Left) - 15 minutes Total: Thigh (Left) - 15 minutes  COMPLICATIONS:  None apparent  DISPOSITION:  Extubated, awake and stable to recovery.  INDICATION FOR PROCEDURE: The patient is a 63 year old male without significant past medical history.  He injured his left ankle last week while helping his son build a deck.  He got his foot caught between 2 joists and fell twisting his left ankle.  He sustained a medial malleolus fracture which is displaced.  He presents now for operative treatment of this unstable left ankle fracture.  The risks and benefits of the alternative treatment options have been discussed in detail.  The patient wishes to proceed with surgery and specifically understands risks of bleeding, infection, nerve damage, blood clots, need for additional surgery, amputation and death.  PROCEDURE IN DETAIL:  After pre operative consent was obtained, and the correct operative site was identified, the patient was brought to the operating room and placed supine on the OR table.  Anesthesia was administered.  Pre-operative antibiotics were administered.  A surgical timeout was taken.  The left lower extremity was prepped and draped in standard sterile fashion with a tourniquet around the calf.  The extremity was elevated and the calf tourniquet was inflated to 200 mmHg.  An incision was then made over the medial malleolus.  Dissection was carried down through the  subcutaneous tissues.  The fracture site was identified.  Hematoma and periosteum was removed from the fracture site.  The fracture was reduced and held with a tenaculum.  K wires were then inserted across the fracture site.  Radiographs confirmed appropriate reduction of the fracture and position of the K wires.  Two 4 mm x 40 mm partially-threaded cannulated screws from the Biomet 4.0 cannulated screw set were inserted.  Both were noted to have excellent purchase and compressed the fracture site appropriately.  AP, mortise and lateral radiographs confirmed appropriate position and length of both screws and appropriate reduction of the fracture.  Stress examination was then performed.  Dorsiflexion and external rotation stress was applied to the supinated forefoot.  There was no widening of the ankle mortise or medial clear space.  Wound was irrigated and closed with Vicryl and nylon.  Sterile dressings were applied followed by a well-padded short leg splint.  The tourniquet was released after application of the dressings.  The patient was awakened from anesthesia and transported to the recovery room in stable condition.   FOLLOW UP PLAN: Nonweightbearing on the left lower extremity.  Follow-up in the office in 2 weeks for suture removal and conversion to a cam boot to initiate early range of motion and weightbearing.  Aspirin for DVT prophylaxis.   RADIOGRAPHS: AP, mortise and lateral radiographs of the left ankle are obtained intraoperatively.  These show interval reduction and fixation of the left ankle medial malleolus fracture.  Hardware is appropriately positioned and of the appropriate lengths.

## 2018-11-29 NOTE — H&P (Signed)
Jorge Humphrey is an 63 y.o. male.   Chief Complaint: Left ankle pain HPI: The patient is a 63 year old male without significant past medical history.  He was helping his son build a deck last week when he fell and twisted his left ankle.  X-rays reveal a displaced medial malleolus fracture.  He presents now for operative treatment of this displaced and unstable left ankle fracture.  Past Medical History:  Diagnosis Date  . Closed disp fracture of left medial malleolus with routine healing   . PPD positive    h/o  . RBBB     Past Surgical History:  Procedure Laterality Date  . LIPOMA EXCISION     R leg   . TONSILLECTOMY    . TRIGGER FINGER RELEASE Right 2019   two finger, local anesthesia, Dr Amedeo Plenty  . VASECTOMY      Family History  Problem Relation Age of Onset  . Depression Daughter   . Migraines Daughter   . Non-Hodgkin's lymphoma Father   . Melanoma Father   . Heart block Other        pacemaker-cousin  . Prostate cancer Other        cousin at age 30  . Cancer Brother        glioblastoma  . Colon cancer Neg Hx   . Diabetes Neg Hx   . CAD Neg Hx    Social History:  reports that he quit smoking about 40 years ago. He has never used smokeless tobacco. He reports current alcohol use. He reports that he does not use drugs.  Allergies: No Known Allergies  Medications Prior to Admission  Medication Sig Dispense Refill  . cetirizine (ZYRTEC) 10 MG tablet Take 10 mg by mouth daily as needed for allergies.    Marland Kitchen ibuprofen (ADVIL) 600 MG tablet Take 600 mg by mouth every 6 (six) hours as needed.    . Multiple Vitamin (MULTIVITAMIN WITH MINERALS) TABS tablet Take 1 tablet by mouth daily.      Results for orders placed or performed during the hospital encounter of 11/28/18 (from the past 48 hour(s))  SARS CORONAVIRUS 2 (TAT 6-24 HRS) Nasopharyngeal Nasopharyngeal Swab     Status: None   Collection Time: 11/28/18 11:21 AM   Specimen: Nasopharyngeal Swab  Result Value Ref  Range   SARS Coronavirus 2 NEGATIVE NEGATIVE    Comment: (NOTE) SARS-CoV-2 target nucleic acids are NOT DETECTED. The SARS-CoV-2 RNA is generally detectable in upper and lower respiratory specimens during the acute phase of infection. Negative results do not preclude SARS-CoV-2 infection, do not rule out co-infections with other pathogens, and should not be used as the sole basis for treatment or other patient management decisions. Negative results must be combined with clinical observations, patient history, and epidemiological information. The expected result is Negative. Fact Sheet for Patients: SugarRoll.be Fact Sheet for Healthcare Providers: https://www.woods-mathews.com/ This test is not yet approved or cleared by the [place] FDA and  has been authorized for detection and/or diagnosis of SARS-CoV-2 by FDA under an Emergency Use Authorization (EUA). This EUA will remain  in effect (meaning this test can be used) for the duration of the COVID-19 declaration under Section 56 4(b)(1) of the Act, 21 U.S.C. section 360bbb-3(b)(1), unless the authorization is terminated or revoked sooner. Performed at [facility], [place] [address], [place], [place] [address]    No results found.  ROS no recent fever, chills, nausea, vomiting or changes in his appetite  Blood pressure  130/73, pulse 64, temperature 97.7 F (36.5 C), temperature source Oral, resp. rate 16, height 5' 8.5" (1.74 m), weight 69.2 kg, SpO2 100 %. Physical Exam  Well-nourished well-developed male in no apparent distress.  Alert and oriented x4.  Mood and affect are normal.  Extraocular motions are intact.  Respirations are unlabored.  Gait is nonweightbearing on the left.  Left ankle has healthy skin with only slight swelling.  Tender to palpation at the medial malleolus.  No lymphadenopathy.  Pulses are palpable in the foot.  Normal sensibility to light touch in the foot.   5 out of 5 strength in plantarflexion and dorsiflexion of the toes.  Assessment/Plan Left ankle closed and displaced medial malleolus fracture -to the operating room today for open treatment with internal fixation of this displaced and unstable left ankle fracture.  The risks and benefits of the alternative treatment options have been discussed in detail.  The patient wishes to proceed with surgery and specifically understands risks of bleeding, infection, nerve damage, blood clots, need for additional surgery, amputation and death.   Wylene Simmer, MD Dec 03, 2018, 1:12 PM

## 2018-11-29 NOTE — Discharge Instructions (Addendum)
Wylene Simmer, MD [facility]  Please read the following information regarding your care after surgery.  Medications  You only need a prescription for the narcotic pain medicine (ex. oxycodone, Percocet, Norco).  All of the other medicines listed below are available over the counter. X Aleve 2 pills twice a day for the first 3 days after surgery. X Hydrocodone as prescribed for severe pain  Narcotic pain medicine (ex. oxycodone, Percocet, Vicodin) will cause constipation.  To prevent this problem, take the following medicines while you are taking any pain medicine. X docusate sodium (Colace) 100 mg twice a day X senna (Senokot) 2 tablets twice a day  X To help prevent blood clots, take a baby aspirin (81 mg) twice a day for two weeks after surgery.  You should also get up every hour while you are awake to move around.    Weight Bearing ? Bear weight when you are able on your operated leg or foot. ? Bear weight only on your operated foot in the post-op shoe. X Do not bear any weight on the operated leg or foot.  Cast / Splint / Dressing X Keep your splint, cast or dressing clean and dry.  Dont put anything (coat hanger, pencil, etc) down inside of it.  If it gets damp, use a hair dryer on the cool setting to dry it.  If it gets soaked, call the office to schedule an appointment for a cast change. ? Remove your dressing 3 days after surgery and cover the incisions with dry dressings.    After your dressing, cast or splint is removed; you may shower, but do not soak or scrub the wound.  Allow the water to run over it, and then gently pat it dry.  Swelling It is normal for you to have swelling where you had surgery.  To reduce swelling and pain, keep your toes above your nose for at least 3 days after surgery.  It may be necessary to keep your foot or leg elevated for several weeks.  If it hurts, it should be elevated.  Follow Up Call my office at [phone] when you are discharged from  the hospital or surgery center to schedule an appointment to be seen two weeks after surgery.  Call my office at [phone] if you develop a fever >101.5 F, nausea, vomiting, bleeding from the surgical site or severe pain.      Okay to take next dose of Tylenol at 8:45 PM on 11/29/2018 if needed. Okay to take next dose of Ibuprofen at 10:45 PM on 11/29/2018 if needed.     Post Anesthesia Home Care Instructions  Activity: Get plenty of rest for the remainder of the day. A responsible individual must stay with you for 24 hours following the procedure.  For the next 24 hours, DO NOT: -Drive a car -Paediatric nurse -Drink alcoholic beverages -Take any medication unless instructed by your physician -Make any legal decisions or sign important papers.  Meals: Start with liquid foods such as gelatin or soup. Progress to regular foods as tolerated. Avoid greasy, spicy, heavy foods. If nausea and/or vomiting occur, drink only clear liquids until the nausea and/or vomiting subsides. Call your physician if vomiting continues.  Special Instructions/Symptoms: Your throat may feel dry or sore from the anesthesia or the breathing tube placed in your throat during surgery. If this causes discomfort, gargle with warm salt water. The discomfort should disappear within 24 hours.  If you had a scopolamine patch placed behind your ear  for the management of post- operative nausea and/or vomiting:  1. The medication in the patch is effective for 72 hours, after which it should be removed.  Wrap patch in a tissue and discard in the trash. Wash hands thoroughly with soap and water. 2. You may remove the patch earlier than 72 hours if you experience unpleasant side effects which may include dry mouth, dizziness or visual disturbances. 3. Avoid touching the patch. Wash your hands with soap and water after contact with the patch.       Regional Anesthesia Blocks  1. Numbness or the inability to move the  "blocked" extremity may last from 3-48 hours after placement. The length of time depends on the medication injected and your individual response to the medication. If the numbness is not going away after 48 hours, call your surgeon.  2. The extremity that is blocked will need to be protected until the numbness is gone and the  Strength has returned. Because you cannot feel it, you will need to take extra care to avoid injury. Because it may be weak, you may have difficulty moving it or using it. You may not know what position it is in without looking at it while the block is in effect.  3. For blocks in the legs and feet, returning to weight bearing and walking needs to be done carefully. You will need to wait until the numbness is entirely gone and the strength has returned. You should be able to move your leg and foot normally before you try and bear weight or walk. You will need someone to be with you when you first try to ensure you do not fall and possibly risk injury.  4. Bruising and tenderness at the needle site are common side effects and will resolve in a few days.  5. Persistent numbness or new problems with movement should be communicated to the surgeon or the [facility] [phone] [place] [phone]).

## 2018-11-29 NOTE — Anesthesia Procedure Notes (Signed)
Anesthesia Regional Block: Adductor canal block   Pre-Anesthetic Checklist: ,, timeout performed, Correct Patient, Correct Site, Correct Laterality, Correct Procedure, Correct Position, site marked, Risks and benefits discussed,  Surgical consent,  Pre-op evaluation,  At surgeon's request and post-op pain management  Laterality: Left  Prep: chloraprep       Needles:  Injection technique: Single-shot  Needle Type: Echogenic Stimulator Needle     Needle Length: 9cm  Needle Gauge: 21   Needle insertion depth: 7 cm   Additional Needles:   Procedures:,,,, ultrasound used (permanent image in chart),,,,  Narrative:  Start time: 11/29/2018 12:49 PM End time: 11/29/2018 12:54 PM Injection made incrementally with aspirations every 5 mL.  Performed by: Personally  Anesthesiologist: Josephine Igo, MD       Left Adductor Canal block

## 2018-11-29 NOTE — Anesthesia Preprocedure Evaluation (Signed)
Anesthesia Evaluation  Patient identified by MRN, date of birth, ID band Patient awake    Reviewed: Allergy & Precautions, NPO status , Patient's Chart, lab work & pertinent test results  Airway Mallampati: II  TM Distance: >3 FB Neck ROM: Full    Dental no notable dental hx. (+) Teeth Intact   Pulmonary former smoker,    Pulmonary exam normal breath sounds clear to auscultation       Cardiovascular negative cardio ROS Normal cardiovascular exam+ dysrhythmias  Rhythm:Regular Rate:Normal     Neuro/Psych negative neurological ROS  negative psych ROS   GI/Hepatic negative GI ROS,   Endo/Other  negative endocrine ROS  Renal/GU negative Renal ROS  negative genitourinary   Musculoskeletal Closed Fx left medial malleolus   Abdominal   Peds  Hematology negative hematology ROS (+)   Anesthesia Other Findings   Reproductive/Obstetrics                             Anesthesia Physical Anesthesia Plan  ASA: I  Anesthesia Plan: Regional and MAC   Post-op Pain Management:    Induction:   PONV Risk Score and Plan: 1 and Ondansetron, Propofol infusion and Treatment may vary due to age or medical condition  Airway Management Planned: Natural Airway, Nasal Cannula and Simple Face Mask  Additional Equipment:   Intra-op Plan:   Post-operative Plan:   Informed Consent: I have reviewed the patients History and Physical, chart, labs and discussed the procedure including the risks, benefits and alternatives for the proposed anesthesia with the patient or authorized representative who has indicated his/her understanding and acceptance.     Dental advisory given  Plan Discussed with: CRNA and Surgeon  Anesthesia Plan Comments:         Anesthesia Quick Evaluation

## 2018-11-29 NOTE — Anesthesia Procedure Notes (Signed)
Procedure Name: MAC Date/Time: 11/29/2018 1:39 PM Performed by: Signe Colt, CRNA Pre-anesthesia Checklist: Patient identified, Emergency Drugs available, Suction available, Patient being monitored and Timeout performed Patient Re-evaluated:Patient Re-evaluated prior to induction Oxygen Delivery Method: Simple face mask

## 2018-11-29 NOTE — Anesthesia Postprocedure Evaluation (Signed)
Anesthesia Post Note  Patient: Jorge Humphrey  Procedure(s) Performed: Open reduction internal fixation left medial malleolus fracture (Left Ankle)     Patient location during evaluation: PACU Anesthesia Type: Regional and MAC Level of consciousness: oriented and awake and alert Pain management: pain level controlled Vital Signs Assessment: post-procedure vital signs reviewed and stable Respiratory status: spontaneous breathing, respiratory function stable and nonlabored ventilation Cardiovascular status: blood pressure returned to baseline and stable Postop Assessment: no apparent nausea or vomiting Anesthetic complications: no    Last Vitals:  Vitals:   11/29/18 1445 11/29/18 1500  BP: 137/82 127/83  Pulse: 65 60  Resp: 14 18  Temp:    SpO2: 100% 100%    Last Pain:  Vitals:   11/29/18 1500  TempSrc:   PainSc: 0-No pain                 Itza Maniaci A.

## 2018-11-29 NOTE — Progress Notes (Signed)
Assisted Dr. Royce Macadamia with left, ultrasound guided, popliteal, adductor canal block. Side rails up, monitors on throughout procedure. See vital signs in flow sheet. Tolerated Procedure well.

## 2018-11-30 ENCOUNTER — Encounter (HOSPITAL_BASED_OUTPATIENT_CLINIC_OR_DEPARTMENT_OTHER): Payer: Self-pay | Admitting: Orthopedic Surgery

## 2018-12-17 DIAGNOSIS — S8253XA Displaced fracture of medial malleolus of unspecified tibia, initial encounter for closed fracture: Secondary | ICD-10-CM | POA: Insufficient documentation

## 2018-12-17 DIAGNOSIS — S8252XD Displaced fracture of medial malleolus of left tibia, subsequent encounter for closed fracture with routine healing: Secondary | ICD-10-CM | POA: Diagnosis not present

## 2018-12-26 ENCOUNTER — Other Ambulatory Visit: Payer: Self-pay

## 2018-12-27 ENCOUNTER — Encounter: Payer: Self-pay | Admitting: Internal Medicine

## 2018-12-27 ENCOUNTER — Ambulatory Visit (INDEPENDENT_AMBULATORY_CARE_PROVIDER_SITE_OTHER): Payer: 59 | Admitting: Internal Medicine

## 2018-12-27 ENCOUNTER — Other Ambulatory Visit: Payer: Self-pay

## 2018-12-27 VITALS — BP 127/72 | HR 59 | Temp 97.0°F | Resp 16 | Ht 69.0 in | Wt 155.5 lb

## 2018-12-27 DIAGNOSIS — Z0000 Encounter for general adult medical examination without abnormal findings: Secondary | ICD-10-CM | POA: Diagnosis not present

## 2018-12-27 DIAGNOSIS — Z125 Encounter for screening for malignant neoplasm of prostate: Secondary | ICD-10-CM

## 2018-12-27 LAB — CBC WITH DIFFERENTIAL/PLATELET
Basophils Absolute: 0.1 10*3/uL (ref 0.0–0.1)
Basophils Relative: 1.2 % (ref 0.0–3.0)
Eosinophils Absolute: 0.2 10*3/uL (ref 0.0–0.7)
Eosinophils Relative: 3.8 % (ref 0.0–5.0)
HCT: 40.7 % (ref 39.0–52.0)
Hemoglobin: 14.1 g/dL (ref 13.0–17.0)
Lymphocytes Relative: 26.1 % (ref 12.0–46.0)
Lymphs Abs: 1.3 10*3/uL (ref 0.7–4.0)
MCHC: 34.5 g/dL (ref 30.0–36.0)
MCV: 88.3 fl (ref 78.0–100.0)
Monocytes Absolute: 0.5 10*3/uL (ref 0.1–1.0)
Monocytes Relative: 9.7 % (ref 3.0–12.0)
Neutro Abs: 3 10*3/uL (ref 1.4–7.7)
Neutrophils Relative %: 59.2 % (ref 43.0–77.0)
Platelets: 177 10*3/uL (ref 150.0–400.0)
RBC: 4.62 Mil/uL (ref 4.22–5.81)
RDW: 13.2 % (ref 11.5–15.5)
WBC: 5 10*3/uL (ref 4.0–10.5)

## 2018-12-27 LAB — COMPREHENSIVE METABOLIC PANEL
ALT: 12 U/L (ref 0–53)
AST: 16 U/L (ref 0–37)
Albumin: 4.2 g/dL (ref 3.5–5.2)
Alkaline Phosphatase: 57 U/L (ref 39–117)
BUN: 16 mg/dL (ref 6–23)
CO2: 30 mEq/L (ref 19–32)
Calcium: 9.2 mg/dL (ref 8.4–10.5)
Chloride: 104 mEq/L (ref 96–112)
Creatinine, Ser: 0.88 mg/dL (ref 0.40–1.50)
GFR: 87.9 mL/min (ref >60.00)
Glucose, Bld: 81 mg/dL (ref 70–99)
Potassium: 4.1 mEq/L (ref 3.5–5.1)
Sodium: 142 mEq/L (ref 135–145)
Total Bilirubin: 0.6 mg/dL (ref 0.2–1.2)
Total Protein: 6.2 g/dL (ref 6.0–8.3)

## 2018-12-27 LAB — HEMOGLOBIN A1C: Hgb A1c MFr Bld: 5.5 % (ref 4.6–6.5)

## 2018-12-27 LAB — TSH: TSH: 2.29 u[IU]/mL (ref 0.35–4.50)

## 2018-12-27 LAB — PSA: PSA: 1.02 ng/mL (ref 0.10–4.00)

## 2018-12-27 LAB — LIPID PANEL
Cholesterol: 172 mg/dL (ref 0–200)
HDL: 47.3 mg/dL (ref >39.00)
LDL Cholesterol: 116 mg/dL — ABNORMAL HIGH (ref 0–99)
NonHDL: 125.14
Total CHOL/HDL Ratio: 4
Triglycerides: 46 mg/dL (ref 0.0–149.0)
VLDL: 9.2 mg/dL (ref 0.0–40.0)

## 2018-12-27 NOTE — Patient Instructions (Signed)
GO TO THE LAB : Get the blood work     GO TO THE FRONT DESK Schedule your next appointment   for a physical exam in 1 year   Please check and see if you are up-to-date on your tetanus shot, every 10 years

## 2018-12-27 NOTE — Assessment & Plan Note (Signed)
Here for CPX Doing well other than ankle fracture, he seems to be recovering well. Fracture was traumatic (no need for DEXA)  Some stress due to COVID-19 quarantine, counseled.  Will call if that becomes an issue.  Occasionally needs a sleeping pill (leftover xanax) RTC 1 year

## 2018-12-27 NOTE — Progress Notes (Signed)
Pre visit review using our clinic review tool, if applicable. No additional management support is needed unless otherwise documented below in the visit note. 

## 2018-12-27 NOTE — Progress Notes (Signed)
Subjective:    Patient ID: Jorge Humphrey, male    DOB: 12-Aug-1955, 63 y.o.   MRN: [id number]  DOS:  12/27/2018 Type of visit - description: CPX Had a fracture, status post ORIF ankle, left, 11/29/2018.  Recovering well.  Wt Readings from Last 3 Encounters:  12/27/18 155 lb 8 oz (70.5 kg)  11/29/18 152 lb 8.9 oz (69.2 kg)  05/23/18 161 lb (73 kg)     Review of Systems   Other than above, a 14 point review of systems is negative   Past Medical History:  Diagnosis Date  . Closed disp fracture of left medial malleolus with routine healing   . PPD positive    h/o  . RBBB     Past Surgical History:  Procedure Laterality Date  . LIPOMA EXCISION     R leg   . ORIF ANKLE FRACTURE Left 11/29/2018   Procedure: Open reduction internal fixation left medial malleolus fracture;  Surgeon: Wylene Simmer, MD;  Location: [place];  Service: Orthopedics;  Laterality: Left;  . TONSILLECTOMY    . TRIGGER FINGER RELEASE Right 2019   two finger, local anesthesia, Dr Amedeo Plenty  . VASECTOMY      Social History   Socioeconomic History  . Marital status: Married    Spouse name: Not on file  . Number of children: 3  . Years of education: Not on file  . Highest education level: Not on file  Occupational History  . Occupation: Programmer, multimedia: [employer]  . Financial resource strain: Not on file  . Food insecurity    Worry: Not on file    Inability: Not on file  . Transportation needs    Medical: Not on file    Non-medical: Not on file  Tobacco Use  . Smoking status: Former Smoker    Quit date: 03/07/1978    Years since quitting: 40.8  . Smokeless tobacco: Never Used  Substance and Sexual Activity  . Alcohol use: Yes    Comment: social  . Drug use: No  . Sexual activity: Not on file  Lifestyle  . Physical activity    Days per week: Not on file    Minutes per session: Not on file  . Stress: Not on file  Relationships  . Social Clinical research associate on phone: Not on file    Gets together: Not on file    Attends religious service: Not on file    Active member of club or organization: Not on file    Attends meetings of clubs or organizations: Not on file    Relationship status: Not on file  . Intimate partner violence    Fear of current or ex partner: Not on file    Emotionally abused: Not on file    Physically abused: Not on file    Forced sexual activity: Not on file  Other Topics Concern  . Not on file  Social History Narrative   Patient is from [place], [place] from [place].   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 If you have any questions, ask your nurse or doctor.        STOP taking these medications   HYDROcodone-acetaminophen 5-325 MG  tablet Commonly known as: Norco Stopped by: Kathlene November, MD     TAKE these medications   cetirizine 10 MG tablet Commonly known as: ZYRTEC Take 10 mg by mouth daily as needed for allergies.   multivitamin with minerals Tabs tablet Take 1 tablet by mouth daily.      Family History  Problem Relation Age of Onset  . Depression Daughter   . Migraines Daughter   . Non-Hodgkin's lymphoma Father   . Melanoma Father   . Heart block Other        pacemaker-cousin  . Prostate cancer Other        cousin at age 41  . Cancer Brother        glioblastoma  . Colon cancer Neg Hx   . Diabetes Neg Hx   . CAD Neg Hx          Objective:   Physical Exam BP 127/72 (BP Location: Left Arm, Patient Position: Sitting, Cuff Size: Small)   Pulse (!) 59   Temp (!) 97 F (36.1 C) (Temporal)   Resp 16   Ht 5\' 9"  (1.753 m)   Wt 155 lb 8 oz (70.5 kg)   SpO2 100%   BMI 22.96 kg/m  General: Well developed, NAD, BMI noted Neck: No  thyromegaly  HEENT:  Normocephalic . Face symmetric, atraumatic Lungs:  CTA B Normal respiratory effort, no  intercostal retractions, no accessory muscle use. Heart: RRR,  no murmur.  No pretibial edema bilaterally  Abdomen:  Not distended, soft, non-tender. No rebound or rigidity.   Skin: Exposed areas without rash. Not pale. Not jaundice DRE: Normal sphincter tone, brown stools, prostate Neurologic:  alert & oriented X3.  Speech normal, gait limited by using left foot boot Strength symmetric and appropriate for age.  Psych: Cognition and judgment appear intact.  Cooperative with normal attention span and concentration.  Behavior appropriate. No anxious or depressed appearing.     Assessment     Assessment A1C 6.0 (2009)  History + PPD, s/p treatment +FH melanoma: sees derm q year Bradycardia: Chronic, asymptomatic  PLAN: Here for CPX Doing well other than ankle fracture, he seems to be recovering well. Fracture was traumatic (no need for DEXA)  Some stress due to COVID-19 quarantine, counseled.  Will call if that becomes an issue.  Occasionally needs a sleeping pill (leftover xanax) RTC 1 year

## 2018-12-27 NOTE — Assessment & Plan Note (Signed)
Td 2009, booster at work? Pt will check  - flu shot at work today  - s/p shingrex 2  --CCS: Dr Perry--nml--next 2024  -Prostate cancer screening: DRE normal, check a PSA  -Has lost some weight, he thinks is because he is not eating out/in restaurants  as much.  He has not been active due to recent fracture, thinks he probably is losing some muscle mass.  Recommend to monitor the issue and let me know if weight loss become a major problem. -Labs: CMP, FLP, CBC, TSH, PSA, A1c

## 2019-01-01 DIAGNOSIS — D2271 Melanocytic nevi of right lower limb, including hip: Secondary | ICD-10-CM | POA: Diagnosis not present

## 2019-01-01 DIAGNOSIS — L821 Other seborrheic keratosis: Secondary | ICD-10-CM | POA: Diagnosis not present

## 2019-01-01 DIAGNOSIS — Z411 Encounter for cosmetic surgery: Secondary | ICD-10-CM | POA: Diagnosis not present

## 2019-01-01 DIAGNOSIS — L814 Other melanin hyperpigmentation: Secondary | ICD-10-CM | POA: Diagnosis not present

## 2019-01-01 DIAGNOSIS — B079 Viral wart, unspecified: Secondary | ICD-10-CM | POA: Diagnosis not present

## 2019-01-01 DIAGNOSIS — D225 Melanocytic nevi of trunk: Secondary | ICD-10-CM | POA: Diagnosis not present

## 2019-01-01 DIAGNOSIS — Z23 Encounter for immunization: Secondary | ICD-10-CM | POA: Diagnosis not present

## 2019-01-01 DIAGNOSIS — L719 Rosacea, unspecified: Secondary | ICD-10-CM | POA: Diagnosis not present

## 2019-01-14 DIAGNOSIS — M25572 Pain in left ankle and joints of left foot: Secondary | ICD-10-CM | POA: Diagnosis not present

## 2019-01-14 DIAGNOSIS — S8252XD Displaced fracture of medial malleolus of left tibia, subsequent encounter for closed fracture with routine healing: Secondary | ICD-10-CM | POA: Diagnosis not present

## 2019-04-11 MED FILL — METHYLPREDNISOLONE 4 MG TBP: 4 | 5 days supply | Qty: 21 | Fill #0

## 2019-04-17 MED FILL — predniSONE 20 MG TABS: 20 | 20 days supply | Qty: 10 | Fill #0

## 2019-04-22 DIAGNOSIS — H5203 Hypermetropia, bilateral: Secondary | ICD-10-CM | POA: Diagnosis not present

## 2019-04-22 DIAGNOSIS — H52223 Regular astigmatism, bilateral: Secondary | ICD-10-CM | POA: Diagnosis not present

## 2019-07-04 DIAGNOSIS — M79671 Pain in right foot: Secondary | ICD-10-CM | POA: Diagnosis not present

## 2019-07-04 DIAGNOSIS — S86311A Strain of muscle(s) and tendon(s) of peroneal muscle group at lower leg level, right leg, initial encounter: Secondary | ICD-10-CM | POA: Diagnosis not present

## 2019-07-04 MED FILL — MELOXICAM 7.5 MG TABLET: 7.5 | 30 days supply | Qty: 60 | Fill #0

## 2019-07-16 DIAGNOSIS — S86911D Strain of unspecified muscle(s) and tendon(s) at lower leg level, right leg, subsequent encounter: Secondary | ICD-10-CM | POA: Diagnosis not present

## 2019-08-07 ENCOUNTER — Encounter: Payer: Self-pay | Admitting: Internal Medicine

## 2019-08-07 ENCOUNTER — Ambulatory Visit: Payer: 59 | Admitting: Internal Medicine

## 2019-08-07 ENCOUNTER — Other Ambulatory Visit: Payer: Self-pay

## 2019-08-07 VITALS — BP 104/78 | HR 56 | Temp 97.6°F | Resp 16 | Ht 69.0 in | Wt 155.5 lb

## 2019-08-07 DIAGNOSIS — H9311 Tinnitus, right ear: Secondary | ICD-10-CM

## 2019-08-07 MED ORDER — PREDNISONE 10 MG PO TABS: ORAL_TABLET | ORAL | 0 refills | Status: DC

## 2019-08-07 MED FILL — predniSONE 10 MG TABS: 10 | 9 days supply | Qty: 18 | Fill #0

## 2019-08-07 NOTE — Patient Instructions (Signed)
Take prednisone as prescribed for few days  Continue your allergy medications  If you are not back to normal in 2 weeks please let me know for ENT referral

## 2019-08-07 NOTE — Progress Notes (Signed)
Subjective:    Patient ID: Jorge Humphrey, male    DOB: 02/23/1956, 64 y.o.   MRN: [id number]  DOS:  08/07/2019 Type of visit - description: Acute visit Symptoms started 5 days ago: Right-sided tinnitus described as "electric wires touching each other". The symptoms are almost constant, mild, worse at night. Problem started a day after he volunteered an event and he was exposed to relatively loud music on and off. Has taken Sudafed, ibuprofen, Flonase without much help. Denies decreased hearing.   Review of Systems Denies fever chills No ear pain but some feeling of ear fullness. Had sore throat with the onset of symptoms but that is largely resolved Denies any rash of the head or neck No sinus pain or congestion.  Past Medical History:  Diagnosis Date  . Closed disp fracture of left medial malleolus with routine healing   . PPD positive    h/o  . RBBB     Past Surgical History:  Procedure Laterality Date  . LIPOMA EXCISION     R leg   . ORIF ANKLE FRACTURE Left 11/29/2018   Procedure: Open reduction internal fixation left medial malleolus fracture;  Surgeon: Wylene Simmer, MD;  Location: [place];  Service: Orthopedics;  Laterality: Left;  . TONSILLECTOMY    . TRIGGER FINGER RELEASE Right 2019   two finger, local anesthesia, Dr Amedeo Plenty  . VASECTOMY      Allergies as of 08/07/2019   No Known Allergies     Medication List       Accurate as of August 07, 2019 11:59 PM. If you have any questions, ask your nurse or doctor.        cetirizine 10 MG tablet Commonly known as: ZYRTEC Take 10 mg by mouth daily as needed for allergies.   multivitamin with minerals Tabs tablet Take 1 tablet by mouth daily.   predniSONE 10 MG tablet Commonly known as: DELTASONE 3 tabs x 3 days, 2 tabs x 3 days, 1 tab x 3 days Started by: Kathlene November, MD          Objective:   Physical Exam BP 104/78 (BP Location: Left Arm, Patient Position: Sitting, Cuff Size: Small)    Pulse (!) 56   Temp 97.6 F (36.4 C) (Temporal)   Resp 16   Ht 5\' 9"  (1.753 m)   Wt 155 lb 8 oz (70.5 kg)   SpO2 98%   BMI 22.96 kg/m  General:   Well developed, NAD, BMI noted. HEENT:  Normocephalic . Face symmetric, atraumatic. Throat: Symmetric Nose: Not congested Sinuses: No TTP Ears: Both canals are normal, TMs slightly bulge but not red, no discharge. Lungs:  CTA B Normal respiratory effort, no intercostal retractions, no accessory muscle use. Heart: RRR,  no murmur.  Lower extremities: no pretibial edema bilaterally  Skin: Not pale. Not jaundice Neurologic:  alert & oriented X3.  Speech normal, gait appropriate for age and unassisted Psych--  Cognition and judgment appear intact.  Cooperative with normal attention span and concentration.  Behavior appropriate. No anxious or depressed appearing.      Assessment     Assessment A1C 6.0 (2009)  History + PPD, s/p treatment +FH melanoma: sees derm q year Bradycardia: Chronic, asymptomatic  PLAN: Acute tinnitus: Right-sided, in the context of previous exposure to some moderately loud music, also with evidence of mild serous otitis. We will treat for serous otitis with a round of prednisone, continue Zyrtec, call if not better, ENT referral?  This visit occurred during the SARS-CoV-2 public health emergency.  Safety protocols were in place, including screening questions prior to the visit, additional usage of staff PPE, and extensive cleaning of exam room while observing appropriate contact time as indicated for disinfecting solutions.

## 2019-08-07 NOTE — Progress Notes (Signed)
Pre visit review using our clinic review tool, if applicable. No additional management support is needed unless otherwise documented below in the visit note. 

## 2019-08-08 NOTE — Assessment & Plan Note (Signed)
Acute tinnitus: Right-sided, in the context of previous exposure to some moderately loud music, also with evidence of mild serous otitis. We will treat for serous otitis with a round of prednisone, continue Zyrtec, call if not better, ENT referral?

## 2019-08-22 ENCOUNTER — Encounter: Payer: Self-pay | Admitting: Internal Medicine

## 2019-08-27 ENCOUNTER — Encounter: Payer: Self-pay | Admitting: Internal Medicine

## 2019-09-18 ENCOUNTER — Ambulatory Visit: Payer: 59 | Attending: Internal Medicine

## 2019-09-18 DIAGNOSIS — Z20822 Contact with and (suspected) exposure to covid-19: Secondary | ICD-10-CM

## 2019-09-19 LAB — NOVEL CORONAVIRUS, NAA: SARS-CoV-2, NAA: NOT DETECTED

## 2019-09-19 LAB — SARS-COV-2, NAA 2 DAY TAT

## 2019-10-09 ENCOUNTER — Encounter: Payer: Self-pay | Admitting: Internal Medicine

## 2019-10-09 ENCOUNTER — Ambulatory Visit: Payer: 59 | Admitting: Internal Medicine

## 2019-10-09 ENCOUNTER — Other Ambulatory Visit: Payer: Self-pay

## 2019-10-09 VITALS — BP 128/81 | HR 61 | Temp 98.0°F | Resp 16 | Ht 69.0 in | Wt 158.5 lb

## 2019-10-09 DIAGNOSIS — G4700 Insomnia, unspecified: Secondary | ICD-10-CM | POA: Diagnosis not present

## 2019-10-09 MED ORDER — ALPRAZOLAM 0.5 MG PO TABS: 0.5000 mg | ORAL_TABLET | Freq: Every evening | ORAL | 1 refills | Status: DC | PRN

## 2019-10-09 MED FILL — ALPRAZolam 0.5 MG TABS: 0.5 | 30 days supply | Qty: 30 | Fill #0

## 2019-10-09 NOTE — Progress Notes (Signed)
Pre visit review using our clinic review tool, if applicable. No additional management support is needed unless otherwise documented below in the visit note. 

## 2019-10-09 NOTE — Progress Notes (Signed)
   Subjective:    Patient ID: Jorge Humphrey, male    DOB: 1956/02/19, 64 y.o.   MRN: [id number]  DOS:  10/09/2019 Type of visit - description: Follow-up Request a refill on Xanax, he take it sporadically for insomnia. Typically take half tablet early in the night and occasionally a second half tablet later in if needed. Reports stress, mostly work-related.   Review of Systems See above   Past Medical History:  Diagnosis Date  . Closed disp fracture of left medial malleolus with routine healing   . PPD positive    h/o  . RBBB     Past Surgical History:  Procedure Laterality Date  . LIPOMA EXCISION     R leg   . ORIF ANKLE FRACTURE Left 11/29/2018   Procedure: Open reduction internal fixation left medial malleolus fracture;  Surgeon: Wylene Simmer, MD;  Location: [place];  Service: Orthopedics;  Laterality: Left;  . TONSILLECTOMY    . TRIGGER FINGER RELEASE Right 2019   two finger, local anesthesia, Dr Amedeo Plenty  . VASECTOMY      Allergies as of 10/09/2019   No Known Allergies     Medication List       Accurate as of October 09, 2019  3:51 PM. If you have any questions, ask your nurse or doctor.        cetirizine 10 MG tablet Commonly known as: ZYRTEC Take 10 mg by mouth daily as needed for allergies.   multivitamin with minerals Tabs tablet Take 1 tablet by mouth daily.   predniSONE 10 MG tablet Commonly known as: DELTASONE 3 tabs x 3 days, 2 tabs x 3 days, 1 tab x 3 days          Objective:   Physical Exam BP 128/81 (BP Location: Left Arm, Patient Position: Sitting, Cuff Size: Small)   Pulse 61   Temp 98 F (36.7 C) (Oral)   Resp 16   Ht 5\' 9"  (1.753 m)   Wt 158 lb 8 oz (71.9 kg)   SpO2 99%   BMI 23.41 kg/m  General:   Well developed, NAD, BMI noted. HEENT:  Normocephalic . Face symmetric, atraumatic  Neurologic:  alert & oriented X3.  Speech normal, gait appropriate for age and unassisted Psych--  Cognition and judgment  appear intact.  Cooperative with normal attention span and concentration.  Behavior appropriate. No anxious or depressed appearing.      Assessment    Assessment A1C 6.0 (2009) Insomnia  History + PPD, s/p treatment +FH melanoma: sees derm q year Bradycardia: Chronic, asymptomatic  PLAN: Insomnia: Stress related, listening therapy provided, take Xanax sporadically, contract signed, Rx sent. RTC 12-2019 CPX   his visit occurred during the SARS-CoV-2 public health emergency.  Safety protocols were in place, including screening questions prior to the visit, additional usage of staff PPE, and extensive cleaning of exam room while observing appropriate contact time as indicated for disinfecting solutions.

## 2019-10-09 NOTE — Patient Instructions (Signed)
Please come back for a physical by October

## 2019-10-10 NOTE — Assessment & Plan Note (Signed)
Insomnia: Stress related, listening therapy provided, take Xanax sporadically, contract signed, Rx sent. RTC 12-2019 CPX

## 2019-10-23 ENCOUNTER — Other Ambulatory Visit: Payer: 59

## 2019-10-23 ENCOUNTER — Other Ambulatory Visit: Payer: Self-pay | Admitting: Critical Care Medicine

## 2019-10-23 DIAGNOSIS — Z20822 Contact with and (suspected) exposure to covid-19: Secondary | ICD-10-CM | POA: Diagnosis not present

## 2019-10-24 LAB — SARS-COV-2, NAA 2 DAY TAT

## 2019-10-24 LAB — NOVEL CORONAVIRUS, NAA: SARS-CoV-2, NAA: NOT DETECTED

## 2019-12-11 ENCOUNTER — Other Ambulatory Visit: Payer: 59

## 2019-12-11 ENCOUNTER — Other Ambulatory Visit: Payer: Self-pay

## 2019-12-11 DIAGNOSIS — Z20822 Contact with and (suspected) exposure to covid-19: Secondary | ICD-10-CM | POA: Diagnosis not present

## 2019-12-12 LAB — SPECIMEN STATUS REPORT

## 2019-12-12 LAB — NOVEL CORONAVIRUS, NAA: SARS-CoV-2, NAA: NOT DETECTED

## 2019-12-12 LAB — SARS-COV-2, NAA 2 DAY TAT

## 2020-01-07 ENCOUNTER — Other Ambulatory Visit (HOSPITAL_COMMUNITY): Payer: Self-pay | Admitting: Dermatology

## 2020-01-07 DIAGNOSIS — L821 Other seborrheic keratosis: Secondary | ICD-10-CM | POA: Diagnosis not present

## 2020-01-07 DIAGNOSIS — L570 Actinic keratosis: Secondary | ICD-10-CM | POA: Diagnosis not present

## 2020-01-07 DIAGNOSIS — D225 Melanocytic nevi of trunk: Secondary | ICD-10-CM | POA: Diagnosis not present

## 2020-01-07 DIAGNOSIS — L578 Other skin changes due to chronic exposure to nonionizing radiation: Secondary | ICD-10-CM | POA: Diagnosis not present

## 2020-01-07 DIAGNOSIS — L219 Seborrheic dermatitis, unspecified: Secondary | ICD-10-CM | POA: Diagnosis not present

## 2020-01-07 DIAGNOSIS — D2271 Melanocytic nevi of right lower limb, including hip: Secondary | ICD-10-CM | POA: Diagnosis not present

## 2020-01-07 MED FILL — FLUOCINONIDE 0.05% SOLUTION: 0.05 | 7 days supply | Qty: 60 | Fill #0

## 2020-01-20 ENCOUNTER — Other Ambulatory Visit: Payer: 59

## 2020-01-20 ENCOUNTER — Other Ambulatory Visit: Payer: Self-pay

## 2020-01-20 DIAGNOSIS — M65332 Trigger finger, left middle finger: Secondary | ICD-10-CM | POA: Diagnosis not present

## 2020-01-20 DIAGNOSIS — M79641 Pain in right hand: Secondary | ICD-10-CM | POA: Diagnosis not present

## 2020-01-20 DIAGNOSIS — Z20822 Contact with and (suspected) exposure to covid-19: Secondary | ICD-10-CM

## 2020-01-20 DIAGNOSIS — M79642 Pain in left hand: Secondary | ICD-10-CM | POA: Diagnosis not present

## 2020-01-21 LAB — NOVEL CORONAVIRUS, NAA: SARS-CoV-2, NAA: NOT DETECTED

## 2020-01-21 LAB — SARS-COV-2, NAA 2 DAY TAT

## 2020-02-06 ENCOUNTER — Other Ambulatory Visit: Payer: 59

## 2020-02-06 DIAGNOSIS — Z20822 Contact with and (suspected) exposure to covid-19: Secondary | ICD-10-CM | POA: Diagnosis not present

## 2020-02-07 LAB — NOVEL CORONAVIRUS, NAA: SARS-CoV-2, NAA: NOT DETECTED

## 2020-02-07 LAB — SARS-COV-2, NAA 2 DAY TAT

## 2020-02-26 ENCOUNTER — Other Ambulatory Visit: Payer: Self-pay

## 2020-02-26 ENCOUNTER — Ambulatory Visit (INDEPENDENT_AMBULATORY_CARE_PROVIDER_SITE_OTHER): Payer: 59 | Admitting: Internal Medicine

## 2020-02-26 ENCOUNTER — Encounter: Payer: Self-pay | Admitting: Internal Medicine

## 2020-02-26 VITALS — BP 119/80 | HR 55 | Temp 98.0°F | Ht 69.0 in | Wt 161.0 lb

## 2020-02-26 DIAGNOSIS — F419 Anxiety disorder, unspecified: Secondary | ICD-10-CM

## 2020-02-26 DIAGNOSIS — Z0000 Encounter for general adult medical examination without abnormal findings: Secondary | ICD-10-CM | POA: Diagnosis not present

## 2020-02-26 DIAGNOSIS — R739 Hyperglycemia, unspecified: Secondary | ICD-10-CM | POA: Diagnosis not present

## 2020-02-26 DIAGNOSIS — Z79899 Other long term (current) drug therapy: Secondary | ICD-10-CM | POA: Diagnosis not present

## 2020-02-26 LAB — HEMOGLOBIN A1C: Hgb A1c MFr Bld: 5.8 % (ref 4.6–6.5)

## 2020-02-26 LAB — CBC WITH DIFFERENTIAL/PLATELET
Basophils Absolute: 0 10*3/uL (ref 0.0–0.1)
Basophils Relative: 0.9 % (ref 0.0–3.0)
Eosinophils Absolute: 0.2 10*3/uL (ref 0.0–0.7)
Eosinophils Relative: 4.2 % (ref 0.0–5.0)
HCT: 42.3 % (ref 39.0–52.0)
Hemoglobin: 14.1 g/dL (ref 13.0–17.0)
Lymphocytes Relative: 29.4 % (ref 12.0–46.0)
Lymphs Abs: 1.4 10*3/uL (ref 0.7–4.0)
MCHC: 33.3 g/dL (ref 30.0–36.0)
MCV: 88.1 fl (ref 78.0–100.0)
Monocytes Absolute: 0.6 10*3/uL (ref 0.1–1.0)
Monocytes Relative: 12.2 % — ABNORMAL HIGH (ref 3.0–12.0)
Neutro Abs: 2.6 10*3/uL (ref 1.4–7.7)
Neutrophils Relative %: 53.3 % (ref 43.0–77.0)
Platelets: 185 10*3/uL (ref 150.0–400.0)
RBC: 4.8 Mil/uL (ref 4.22–5.81)
RDW: 13.3 % (ref 11.5–15.5)
WBC: 4.8 10*3/uL (ref 4.0–10.5)

## 2020-02-26 LAB — LIPID PANEL
Cholesterol: 185 mg/dL (ref 0–200)
HDL: 46.1 mg/dL (ref >39.00)
LDL Cholesterol: 127 mg/dL — ABNORMAL HIGH (ref 0–99)
NonHDL: 139.1
Total CHOL/HDL Ratio: 4
Triglycerides: 61 mg/dL (ref 0.0–149.0)
VLDL: 12.2 mg/dL (ref 0.0–40.0)

## 2020-02-26 LAB — COMPREHENSIVE METABOLIC PANEL
ALT: 18 U/L (ref 0–53)
AST: 17 U/L (ref 0–37)
Albumin: 4.1 g/dL (ref 3.5–5.2)
Alkaline Phosphatase: 51 U/L (ref 39–117)
BUN: 19 mg/dL (ref 6–23)
CO2: 29 mEq/L (ref 19–32)
Calcium: 9.2 mg/dL (ref 8.4–10.5)
Chloride: 104 mEq/L (ref 96–112)
Creatinine, Ser: 0.94 mg/dL (ref 0.40–1.50)
GFR: 86.79 mL/min (ref >60.00)
Glucose, Bld: 94 mg/dL (ref 70–99)
Potassium: 4.3 mEq/L (ref 3.5–5.1)
Sodium: 140 mEq/L (ref 135–145)
Total Bilirubin: 0.4 mg/dL (ref 0.2–1.2)
Total Protein: 6.2 g/dL (ref 6.0–8.3)

## 2020-02-26 NOTE — Assessment & Plan Note (Signed)
Here for CPX Insomnia: Rarely takes Xanax, contract and UDS today. Tinnitus: Was seen with tinnitus few months ago, resolved. RTC 1 year

## 2020-02-26 NOTE — Assessment & Plan Note (Signed)
-  Td 2019 -COVID vaccines x3 - flu shot at work   - s/p shingrex 2  --CCS: Dr Perry--nml--next 2024  -Prostate cancer screening: DRE and PSA normal 12-2018 -Lifestyle: He remains active doing home projects, eats healthy most of the time.  Encouraged to exercise regularly as well. -He is 10-year CV RF is 8.2%.based on last FLP -Labs: CMP, FLP, CBC, A1c.

## 2020-02-26 NOTE — Patient Instructions (Signed)
   GO TO THE LAB : Get the blood work     GO TO THE FRONT DESK, PLEASE SCHEDULE YOUR APPOINTMENTS Come back for a physical exam in 1 year 

## 2020-02-26 NOTE — Progress Notes (Signed)
Subjective:    Patient ID: Jorge Humphrey, male    DOB: 06-21-1955, 64 y.o.   MRN: [id number]  DOS:  02/26/2020 Type of visit - description:  CPX Since the last office visit is doing well and has no major concerns.   Wt Readings from Last 3 Encounters:  02/26/20 161 lb (73 kg)  10/09/19 158 lb 8 oz (71.9 kg)  08/07/19 155 lb 8 oz (70.5 kg)     Review of Systems  A 14 point review of systems is negative    Past Medical History:  Diagnosis Date  . Closed disp fracture of left medial malleolus with routine healing   . PPD positive    h/o  . RBBB     Past Surgical History:  Procedure Laterality Date  . LIPOMA EXCISION     R leg   . ORIF ANKLE FRACTURE Left 11/29/2018   Procedure: Open reduction internal fixation left medial malleolus fracture;  Surgeon: Wylene Simmer, MD;  Location: [facility];  Service: Orthopedics;  Laterality: Left;  . TONSILLECTOMY    . TRIGGER FINGER RELEASE Right 2019   two finger, local anesthesia, Dr Amedeo Plenty  . VASECTOMY      Allergies as of 02/26/2020   No Known Allergies     Medication List       Accurate as of February 26, 2020  5:07 PM. If you have any questions, ask your nurse or doctor.        ALPRAZolam 0.5 MG tablet Commonly known as: Xanax Take 1 tablet (0.5 mg total) by mouth at bedtime as needed for anxiety.   cetirizine 10 MG tablet Commonly known as: ZYRTEC Take 10 mg by mouth daily as needed for allergies.   fluocinonide 0.05 % external solution Commonly known as: LIDEX Apply topically 2 (two) times daily as needed.   multivitamin with minerals Tabs tablet Take 1 tablet by mouth daily.          Objective:   Physical Exam BP 119/80 (BP Location: Right Arm, Patient Position: Sitting, Cuff Size: Large)   Pulse (!) 55   Temp 98 F (36.7 C) (Oral)   Ht 5\' 9"  (1.753 m)   Wt 161 lb (73 kg)   SpO2 100%   BMI 23.78 kg/m  General: Well developed, NAD, BMI noted Neck: No  thyromegaly  HEENT:   Normocephalic . Face symmetric, atraumatic Lungs:  CTA B Normal respiratory effort, no intercostal retractions, no accessory muscle use. Heart: RRR,  no murmur.  Abdomen:  Not distended, soft, non-tender. No rebound or rigidity.   Lower extremities: no pretibial edema bilaterally  Skin: Exposed areas without rash. Not pale. Not jaundice Neurologic:  alert & oriented X3.  Speech normal, gait appropriate for age and unassisted Strength symmetric and appropriate for age.  Psych: Cognition and judgment appear intact.  Cooperative with normal attention span and concentration.  Behavior appropriate. No anxious or depressed appearing.     Assessment      Assessment A1C 6.0 (2009) Insomnia  History + PPD, s/p treatment +FH melanoma: sees derm q year Bradycardia: Chronic, asymptomatic  PLAN: Here for CPX Insomnia: Rarely takes Xanax, contract and UDS today. Tinnitus: Was seen with tinnitus few months ago, resolved. RTC 1 year   This visit occurred during the SARS-CoV-2 public health emergency.  Safety protocols were in place, including screening questions prior to the visit, additional usage of staff PPE, and extensive cleaning of exam room while observing appropriate contact time  as indicated for disinfecting solutions.

## 2020-02-28 LAB — DRUG MONITORING, PANEL 8 WITH CONFIRMATION, URINE
6 Acetylmorphine: NEGATIVE ng/mL (ref <10)
Alcohol Metabolites: POSITIVE ng/mL — AB
Amphetamines: NEGATIVE ng/mL (ref <500)
Benzodiazepines: NEGATIVE ng/mL (ref <100)
Buprenorphine, Urine: NEGATIVE ng/mL (ref <5)
Cocaine Metabolite: NEGATIVE ng/mL (ref <150)
Creatinine: 38.4 mg/dL
Ethyl Sulfate (ETS): 140 ng/mL — ABNORMAL HIGH (ref <100)
MDMA: NEGATIVE ng/mL (ref <500)
Marijuana Metabolite: NEGATIVE ng/mL (ref <20)
Opiates: NEGATIVE ng/mL (ref <100)
Oxidant: NEGATIVE ug/mL
Oxycodone: NEGATIVE ng/mL (ref <100)
pH: 6.7 (ref 4.5–9.0)

## 2020-02-28 LAB — DM TEMPLATE

## 2020-03-04 DIAGNOSIS — M79642 Pain in left hand: Secondary | ICD-10-CM | POA: Diagnosis not present

## 2020-03-04 DIAGNOSIS — M65332 Trigger finger, left middle finger: Secondary | ICD-10-CM | POA: Diagnosis not present

## 2020-03-08 ENCOUNTER — Encounter: Payer: Self-pay | Admitting: Internal Medicine

## 2020-04-28 DIAGNOSIS — H52223 Regular astigmatism, bilateral: Secondary | ICD-10-CM | POA: Diagnosis not present

## 2020-04-28 DIAGNOSIS — H5203 Hypermetropia, bilateral: Secondary | ICD-10-CM | POA: Diagnosis not present

## 2020-05-06 ENCOUNTER — Other Ambulatory Visit (HOSPITAL_COMMUNITY): Payer: Self-pay | Admitting: Orthopedic Surgery

## 2020-05-06 MED FILL — CEPHALEXIN 500 MG CAPSULE: 500 | 8 days supply | Qty: 32 | Fill #0

## 2020-05-06 MED FILL — HYDROCODON-APAP 5-325: 5-325 | 7 days supply | Qty: 40 | Fill #0

## 2020-05-20 ENCOUNTER — Other Ambulatory Visit (HOSPITAL_COMMUNITY): Payer: Self-pay | Admitting: Pharmacist

## 2020-05-21 ENCOUNTER — Encounter: Payer: Self-pay | Admitting: Internal Medicine

## 2020-05-28 ENCOUNTER — Ambulatory Visit: Payer: 59 | Attending: Critical Care Medicine

## 2020-05-28 DIAGNOSIS — Z20822 Contact with and (suspected) exposure to covid-19: Secondary | ICD-10-CM | POA: Diagnosis not present

## 2020-05-28 DIAGNOSIS — M65332 Trigger finger, left middle finger: Secondary | ICD-10-CM | POA: Diagnosis not present

## 2020-05-30 LAB — NOVEL CORONAVIRUS, NAA

## 2020-06-11 DIAGNOSIS — M25642 Stiffness of left hand, not elsewhere classified: Secondary | ICD-10-CM | POA: Diagnosis not present

## 2020-06-30 ENCOUNTER — Other Ambulatory Visit (HOSPITAL_COMMUNITY): Payer: Self-pay

## 2020-06-30 ENCOUNTER — Telehealth: Payer: Self-pay | Admitting: Internal Medicine

## 2020-06-30 MED ORDER — ALPRAZOLAM 0.5 MG PO TABS
0.5000 mg | ORAL_TABLET | Freq: Every evening | ORAL | 1 refills | Status: DC | PRN
  Filled 2020-06-30: qty 30, 30d supply, fill #0

## 2020-06-30 NOTE — Telephone Encounter (Signed)
Requesting: alprazolam 0.5mg  Contract: 10/09/2019 UDS: 02/26/2020 Last Visit: 02/26/2020 Next Visit: None Last Refill: 10/09/2019 #30 and 1RF  Please Advise

## 2020-06-30 NOTE — Telephone Encounter (Signed)
PDMP okay, Rx sent 

## 2020-10-05 ENCOUNTER — Ambulatory Visit: Payer: 59 | Admitting: Internal Medicine

## 2020-10-05 ENCOUNTER — Other Ambulatory Visit: Payer: Self-pay

## 2020-10-05 ENCOUNTER — Other Ambulatory Visit (HOSPITAL_BASED_OUTPATIENT_CLINIC_OR_DEPARTMENT_OTHER): Payer: Self-pay

## 2020-10-05 ENCOUNTER — Encounter: Payer: Self-pay | Admitting: Internal Medicine

## 2020-10-05 VITALS — BP 116/74 | HR 70 | Temp 97.7°F | Resp 16 | Ht 69.0 in | Wt 160.5 lb

## 2020-10-05 DIAGNOSIS — G4700 Insomnia, unspecified: Secondary | ICD-10-CM | POA: Diagnosis not present

## 2020-10-05 DIAGNOSIS — F419 Anxiety disorder, unspecified: Secondary | ICD-10-CM

## 2020-10-05 MED ORDER — CARESTART COVID-19 HOME TEST VI KIT
PACK | 0 refills | Status: DC
  Filled 2020-10-05: qty 2, 4d supply, fill #0

## 2020-10-05 MED ORDER — ALPRAZOLAM 0.5 MG PO TABS
0.5000 mg | ORAL_TABLET | Freq: Every evening | ORAL | 3 refills | Status: DC | PRN
  Filled 2020-10-05: qty 30, 30d supply, fill #0
  Filled 2020-12-15: qty 30, 30d supply, fill #1
  Filled 2021-03-23: qty 30, 30d supply, fill #2

## 2020-10-05 NOTE — Patient Instructions (Signed)
Next visit December for a physical

## 2020-10-05 NOTE — Progress Notes (Signed)
Subjective:    Patient ID: Jorge Humphrey, male    DOB: 03/22/1955, 65 y.o.   MRN: [id number]  DOS:  10/05/2020 Type of visit - description: f/u  Here for Xanax refill. Reports increased stress at home and at work. He is seeing a Social worker and so is his wife.   Review of Systems Denies depression per se. He remains physically active  Past Medical History:  Diagnosis Date   Closed disp fracture of left medial malleolus with routine healing    PPD positive    h/o   RBBB     Past Surgical History:  Procedure Laterality Date   LIPOMA EXCISION     R leg    ORIF ANKLE FRACTURE Left 11/29/2018   Procedure: Open reduction internal fixation left medial malleolus fracture;  Surgeon: Wylene Simmer, MD;  Location: [facility];  Service: Orthopedics;  Laterality: Left;   TONSILLECTOMY     TRIGGER FINGER RELEASE Right 2019   two finger, local anesthesia, Dr Amedeo Plenty   VASECTOMY      Allergies as of 10/05/2020   No Known Allergies      Medication List        Accurate as of October 05, 2020 11:59 PM. If you have any questions, ask your nurse or doctor.          STOP taking these medications    cephALEXin 500 MG capsule Commonly known as: KEFLEX Stopped by: Kathlene November, MD       TAKE these medications    ALPRAZolam 0.5 MG tablet Commonly known as: Xanax Take 1 tablet (0.5 mg total) by mouth at bedtime as needed for anxiety.   Carestart COVID-19 Home Test Kit Generic drug: COVID-19 At Home Antigen Test USE AS DIRECTED What changed: Another medication with the same name was added. Make sure you understand how and when to take each.   Carestart COVID-19 Home Test Kit Generic drug: COVID-19 At Home Antigen Test Use as directed within package instructions What changed: You were already taking a medication with the same name, and this prescription was added. Make sure you understand how and when to take each.   cetirizine 10 MG tablet Commonly known as:  ZYRTEC Take 10 mg by mouth daily as needed for allergies.   fluocinonide 0.05 % external solution Commonly known as: LIDEX Apply topically 2 (two) times daily as needed.   fluocinonide 0.05 % external solution Commonly known as: LIDEX APPLY TO THE AFFECTED AREA(S) TWO TIMES DAILY AS NEEDED FOR ITCHING FOR 7 DAYS   HYDROcodone-acetaminophen 5-325 MG tablet Commonly known as: NORCO/VICODIN TAKE 1 TABLET BY MOUTH EVERY 4 TO 6 HOURS AS NEEDED FOR PAIN FOR 5 DAYS   multivitamin with minerals Tabs tablet Take 1 tablet by mouth daily.           Objective:   Physical Exam BP 116/74 (BP Location: Left Arm, Patient Position: Sitting, Cuff Size: Small)   Pulse 70   Temp 97.7 F (36.5 C) (Oral)   Resp 16   Ht 5' 9"  (1.753 m)   Wt 160 lb 8 oz (72.8 kg)   SpO2 98%   BMI 23.70 kg/m  General:   Well developed, NAD, BMI noted. HEENT:  Normocephalic . Face symmetric, atraumatic Lower extremities: no pretibial edema bilaterally  Skin: Not pale. Not jaundice Neurologic:  alert & oriented X3.  Speech normal, gait appropriate for age and unassisted Psych--  Cognition and judgment appear intact.  Cooperative with normal  attention span and concentration.  Behavior appropriate. No anxious or depressed appearing.      Assessment     Assessment A1C 6.0 (2009) Insomnia  History + PPD, s/p treatment +FH melanoma: sees derm q year Bradycardia: Chronic, asymptomatic  PLAN: Insomnia, anxiety: Taking Xanax either half or 1 tablet at bedtime most nights for insomnia. We also talk about anxiety, related work & marital issues. Listening therapy provided. He is going to a counselor and so is his wife. I talk about SSRIs but he does not think that is necessary at this point. Plan: RF Xanax, next visit December CPX   This visit occurred during the SARS-CoV-2 public health emergency.  Safety protocols were in place, including screening questions prior to the visit, additional usage of  staff PPE, and extensive cleaning of exam room while observing appropriate contact time as indicated for disinfecting solutions.

## 2020-10-06 NOTE — Assessment & Plan Note (Signed)
Insomnia, anxiety: Taking Xanax either half or 1 tablet at bedtime most nights for insomnia. We also talk about anxiety, related work & marital issues. Listening therapy provided. He is going to a counselor and so is his wife. I talk about SSRIs but he does not think that is necessary at this point. Plan: RF Xanax, next visit December CPX

## 2020-12-15 ENCOUNTER — Other Ambulatory Visit (HOSPITAL_BASED_OUTPATIENT_CLINIC_OR_DEPARTMENT_OTHER): Payer: Self-pay

## 2021-01-21 DIAGNOSIS — L578 Other skin changes due to chronic exposure to nonionizing radiation: Secondary | ICD-10-CM | POA: Diagnosis not present

## 2021-01-21 DIAGNOSIS — L821 Other seborrheic keratosis: Secondary | ICD-10-CM | POA: Diagnosis not present

## 2021-01-21 DIAGNOSIS — L570 Actinic keratosis: Secondary | ICD-10-CM | POA: Diagnosis not present

## 2021-01-21 DIAGNOSIS — D2271 Melanocytic nevi of right lower limb, including hip: Secondary | ICD-10-CM | POA: Diagnosis not present

## 2021-01-21 DIAGNOSIS — L719 Rosacea, unspecified: Secondary | ICD-10-CM | POA: Diagnosis not present

## 2021-01-21 DIAGNOSIS — D225 Melanocytic nevi of trunk: Secondary | ICD-10-CM | POA: Diagnosis not present

## 2021-02-10 DIAGNOSIS — M25511 Pain in right shoulder: Secondary | ICD-10-CM | POA: Diagnosis not present

## 2021-02-10 DIAGNOSIS — M7542 Impingement syndrome of left shoulder: Secondary | ICD-10-CM | POA: Diagnosis not present

## 2021-02-10 DIAGNOSIS — M25512 Pain in left shoulder: Secondary | ICD-10-CM | POA: Diagnosis not present

## 2021-02-11 ENCOUNTER — Encounter (HOSPITAL_COMMUNITY): Payer: Self-pay | Admitting: Occupational Therapy

## 2021-02-11 ENCOUNTER — Other Ambulatory Visit: Payer: Self-pay

## 2021-02-11 ENCOUNTER — Ambulatory Visit (HOSPITAL_COMMUNITY): Payer: 59 | Attending: Orthopedic Surgery | Admitting: Occupational Therapy

## 2021-02-11 DIAGNOSIS — R29898 Other symptoms and signs involving the musculoskeletal system: Secondary | ICD-10-CM | POA: Diagnosis not present

## 2021-02-11 DIAGNOSIS — M25611 Stiffness of right shoulder, not elsewhere classified: Secondary | ICD-10-CM | POA: Diagnosis not present

## 2021-02-11 DIAGNOSIS — M25511 Pain in right shoulder: Secondary | ICD-10-CM | POA: Insufficient documentation

## 2021-02-11 NOTE — Patient Instructions (Signed)
  1) Flexion Wall Stretch    Face wall, place affected handon wall in front of you. Slide hand up the wall  and lean body in towards the wall. Hold for 10 seconds. Repeat 3-5 times. 1-2 times/day.     2) Towel Stretch with Internal Rotation      Gently pull up (or to the side) your affected arm  behind your back with the assist of a towel. Hold 10 seconds, repeat 3-5 times. 1-2 times/day.             3) Corner Stretch    Stand at a corner of a wall, place your arms on the walls with elbows bent. Lean into the corner until a stretch is felt along the front of your chest and/or shoulders. Hold for 10 seconds. Repeat 3-5X, 1-2 times/day.    4) Posterior Capsule Stretch    Bring the involved arm across chest. Grasp elbow and pull toward chest until you feel a stretch in the back of the upper arm and shoulder. Hold 10 seconds. Repeat 3-5X. Complete 1-2 times/day.    5) External Rotation Stretch:      Standing in an open doorway, place your arm on the edge of the doorway with the shoulder at 90 degrees from the side and elbow bent to 90 degrees. Lean forward until you feel a stretch on the front of your shoulder. Keep neck relaxed. Hold 10 seconds, repeat 3-5X. Complete 1-2 times/day

## 2021-02-11 NOTE — Therapy (Signed)
[place], [place], [address] Phone: [phone]   Fax:  [phone]  Occupational Therapy Evaluation  Patient Details  Name: Jorge Humphrey MRN: [id number] Date of Birth: Mar 16, 1955 Referring Provider (OT): Dr. Netta Cedars   Encounter Date: 02/11/2021   OT End of Session - 02/11/21 0807     Visit Number 1    Number of Visits 4    Date for OT Re-Evaluation 03/13/21    Authorization Type [employer]    Authorization Time Period medical necessity review after 25 visits    Authorization - Visit Number 1    Authorization - Number of Visits 25    OT Start Time [phone]    OT Stop Time 0800    OT Time Calculation (min) 32 min    Activity Tolerance Patient tolerated treatment well    Behavior During Therapy [facility] for tasks assessed/performed             Past Medical History:  Diagnosis Date   Closed disp fracture of left medial malleolus with routine healing    PPD positive    h/o   RBBB     Past Surgical History:  Procedure Laterality Date   LIPOMA EXCISION     R leg    ORIF ANKLE FRACTURE Left 11/29/2018   Procedure: Open reduction internal fixation left medial malleolus fracture;  Surgeon: Wylene Simmer, MD;  Location: [facility];  Service: Orthopedics;  Laterality: Left;   TONSILLECTOMY     TRIGGER FINGER RELEASE Right 2019   two finger, local anesthesia, Dr Amedeo Plenty   VASECTOMY      There were no vitals filed for this visit.   Subjective Assessment - 02/11/21 0804     Subjective  S: It started getting worse early to mid October.    Pertinent History Pt is a 65 y/o male presenting with right shoulder pain that has initially began during COVID when framing and finishing a basement, then improved, however has recurred when pt resumed playing hockey. Pt received a cortisone injection yesterday and was referred to occupational therapy for evaluation and treatment by Dr. Netta Cedars.     Special Tests FOTO: 65/100    Patient Stated Goals To have less pain and improve ROM.    Currently in Pain? No/denies               [facility] OT Assessment - 02/11/21 0725       Assessment   Medical Diagnosis right shoulder pain    Referring Provider (OT) Dr. Netta Cedars    Onset Date/Surgical Date 12/09/20    Hand Dominance Right    Prior Therapy None      Precautions   Precautions None      Restrictions   Weight Bearing Restrictions No      Balance Screen   Has the patient fallen in the past 6 months Yes    How many times? unsure    Has the patient had a decrease in activity level because of a fear of falling?  No    Is the patient reluctant to leave their home because of a fear of falling?  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      Written Expression   Dominant Hand Right      Cognition   Overall Cognitive Status Within Functional Limits for tasks assessed      Observation/Other Assessments   Focus on Therapeutic Outcomes (FOTO)  65/100      ROM / Strength   AROM / PROM / Strength AROM;Strength      Palpation   Palpation comment Min fascial restrictions in anterior shoulder and trapezius      AROM   Overall AROM Comments Assessed seated, er/IR adducted    AROM Assessment Site Shoulder    Right/Left Shoulder Right    Right Shoulder Flexion 151 Degrees    Right Shoulder ABduction 158 Degrees    Right Shoulder Internal Rotation 90 Degrees    Right Shoulder External Rotation 65 Degrees      Strength   Overall Strength Comments Assessed seated, er/IR adducted    Strength Assessment Site Shoulder    Right/Left Shoulder Right    Right Shoulder  Flexion 5/5    Right Shoulder ABduction 4/5    Right Shoulder Internal Rotation 5/5    Right Shoulder External Rotation 5/5                              OT Education - 02/11/21 0749     Education Details shoulder stretches    Person(s) Educated Patient    Methods Explanation;Demonstration;Handout    Comprehension Verbalized understanding;Returned demonstration              OT Short Term Goals - 02/11/21 0812       OT SHORT TERM GOAL #1   Title Pt will be provided with and educated on HEP to improve mobility of RUE required for daily task completion.    Time 4    Period Weeks    Status New    Target Date 03/13/21      OT SHORT TERM GOAL #2   Title Pt will decrease pain in RUE to 2/10 or less to improve ability to sleep in preferred position for 3+ hours without waking due to pain.    Time 4    Period Weeks    Status New      OT SHORT TERM GOAL #3   Title Pt will increase RUE A/ROM to WNL to improve mobility required for reaching overhead and behind back during dressing tasks.    Time 4    Period Weeks    Status New      OT SHORT TERM GOAL #4   Title Pt will increase RUE strength to 5/5 throughout to improve ability to play hockey and perform lifting tasks.    Time 4    Period Weeks    Status New                      Plan - 02/11/21 0809     Clinical Impression Statement A: Pt is a 65 y/o male presenting with right shoulder pain that has increased since October and is impacting functional task completion and ADLs. Pt presents with ROM WFL, however RUE ROM is decreased compared to LUE, OT notes mild end range tightness in joint capsule during passive stretching. Pt is very active and works on flexibility prior to playing hockey  or doing stenuous tasks.    OT Occupational Profile and History Problem Focused Assessment - Including review of records relating to presenting problem    Occupational performance deficits (Please refer to  evaluation for details): ADL's;IADL's;Rest and Sleep;Leisure    Body Structure / Function / Physical Skills ADL;Endurance;UE functional use;Fascial restriction;Pain;ROM;IADL;Strength    Rehab Potential Good    Clinical Decision Making Limited treatment options, no task modification necessary    Comorbidities Affecting Occupational Performance: None    Modification or Assistance to Complete Evaluation  No modification of tasks or assist necessary to complete eval    OT Frequency 1x / week    OT Duration 4 weeks    OT Treatment/Interventions Self-care/ADL training;Ultrasound;DME and/or AE instruction;Patient/family education;Cryotherapy;Electrical Stimulation;Moist Heat;Therapeutic exercise;Manual Therapy;Therapeutic activities    Plan P: Pt will benefit from skilled OT services to decrease pain and fascial restrictions, increase joint ROM, strength, and functional use of RUE as dominant. Treatment plan: myofascial release and manual techniques prn, A/ROM, scapular stability/strengthening, general RUE strengthening, modalities prn    OT Home Exercise Plan eval: Shoulder stretches    Consulted and Agree with Plan of Care Patient             Patient will benefit from skilled therapeutic intervention in order to improve the following deficits and impairments:   Body Structure / Function / Physical Skills: ADL, Endurance, UE functional use, Fascial restriction, Pain, ROM, IADL, Strength       Visit Diagnosis: Acute pain of right shoulder  Stiffness of right shoulder, not elsewhere classified  Other symptoms and signs involving the musculoskeletal system    Problem List Patient Active Problem List   Diagnosis Date Noted   Closed fracture of medial malleolus 12/17/2018   PCP NOTES >>>>>>>>>>>>>>>>> 08/05/2015   Insomnia 08/05/2015   Metatarsalgia of both feet 10/17/2013   Allergic rhinitis 04/26/2012   Annual physical exam 11/16/2010   BACK PAIN 05/12/2009   TB SKIN TEST,  POSITIVE, HX OF 09/04/2006    Guadelupe Sabin, OTR/L  [phone] 02/11/2021, 8:15 AM  [place], [place], [address] Phone: [phone]   Fax:  [phone]  Name: DEONTAYE CIVELLO MRN: [id number] Date of Birth: 09/15/1955

## 2021-02-16 ENCOUNTER — Other Ambulatory Visit: Payer: Self-pay

## 2021-02-16 ENCOUNTER — Ambulatory Visit (HOSPITAL_COMMUNITY): Payer: 59 | Admitting: Occupational Therapy

## 2021-02-16 ENCOUNTER — Encounter (HOSPITAL_COMMUNITY): Payer: Self-pay | Admitting: Occupational Therapy

## 2021-02-16 DIAGNOSIS — R29898 Other symptoms and signs involving the musculoskeletal system: Secondary | ICD-10-CM | POA: Diagnosis not present

## 2021-02-16 DIAGNOSIS — M25511 Pain in right shoulder: Secondary | ICD-10-CM | POA: Diagnosis not present

## 2021-02-16 DIAGNOSIS — M25611 Stiffness of right shoulder, not elsewhere classified: Secondary | ICD-10-CM | POA: Diagnosis not present

## 2021-02-16 DIAGNOSIS — M7542 Impingement syndrome of left shoulder: Secondary | ICD-10-CM | POA: Insufficient documentation

## 2021-02-16 NOTE — Therapy (Signed)
[place], [place], [address] Phone: [phone]   Fax:  [phone]  Occupational Therapy Treatment  Patient Details  Name: Jorge Humphrey MRN: [id number] Date of Birth: 1956/02/26 Referring Provider (OT): Dr. Netta Cedars   Encounter Date: 02/16/2021   OT End of Session - 02/16/21 0855     Visit Number 2    Number of Visits 4    Date for OT Re-Evaluation 03/13/21    Authorization Type [employer]    Authorization Time Period medical necessity review after 25 visits    Authorization - Visit Number 2    Authorization - Number of Visits 25    OT Start Time 0815    OT Stop Time 0853    OT Time Calculation (min) 38 min    Activity Tolerance Patient tolerated treatment well    Behavior During Therapy [facility] for tasks assessed/performed             Past Medical History:  Diagnosis Date   Closed disp fracture of left medial malleolus with routine healing    PPD positive    h/o   RBBB     Past Surgical History:  Procedure Laterality Date   LIPOMA EXCISION     R leg    ORIF ANKLE FRACTURE Left 11/29/2018   Procedure: Open reduction internal fixation left medial malleolus fracture;  Surgeon: Wylene Simmer, MD;  Location: [place];  Service: Orthopedics;  Laterality: Left;   TONSILLECTOMY     TRIGGER FINGER RELEASE Right 2019   two finger, local anesthesia, Dr Amedeo Plenty   VASECTOMY      There were no vitals filed for this visit.   Subjective Assessment - 02/16/21 0815     Subjective  S: I woke up laying on the right side last night.    Currently in Pain? Yes    Pain Score 2     Pain Location Shoulder    Pain Orientation Right    Pain Descriptors / Indicators Aching    Pain Type Acute pain    Pain Radiating Towards N/A    Pain Onset In the past 7 days    Pain Frequency Intermittent    Aggravating Factors  laying on it, overuse    Pain Relieving Factors topical cream    Effect  of Pain on Daily Activities min effect on ADLs    Multiple Pain Sites No                [facility] OT Assessment - 02/16/21 0813       Assessment   Medical Diagnosis right shoulder pain      Precautions   Precautions None                      OT Treatments/Exercises (OP) - 02/16/21 0818       Exercises   Exercises Shoulder      Shoulder Exercises: Standing   Protraction AROM;10 reps    Horizontal ABduction AROM;10 reps    External Rotation AROM;10 reps    Internal Rotation AROM;10 reps    Flexion AROM;10 reps    ABduction AROM;10 reps    Extension Theraband;10 reps    Theraband Level (Shoulder Extension) Level 3 (Green)    Row [employer] reps    Theraband Level (Shoulder Row) Level 3 (Green)    Retraction Theraband;10 reps    Theraband Level (Shoulder Retraction) Level 3 (Green)  Shoulder Exercises: Therapy Ball   Other Therapy Ball Exercises green therapy ball: chest presss, flexion, overhead press, circles each direction, 10X each      Shoulder Exercises: ROM/Strengthening   UBE (Upper Arm Bike) Level 3 3' foward 3' reverse, pace: 14.5    X to V Arms 10X    Proximal Shoulder Strengthening, Seated 10X each, no rest breaks    Ball on Wall 1' flexion 1' abduction green ball      Shoulder Exercises: Stretch   Corner Stretch 2 reps;20 seconds    Cross Chest Stretch 2 reps;20 seconds    Internal Rotation Stretch 2 reps   20" vertical towel   External Rotation Stretch 2 reps;20 seconds    Wall Stretch - Flexion 2 reps;20 seconds    Wall Stretch - ABduction 2 reps;20 seconds                    OT Education - 02/16/21 0821     Education Details A/ROM and green scapular theraband    Person(s) Educated Patient    Methods Explanation;Demonstration;Handout    Comprehension Verbalized understanding;Returned demonstration              OT Short Term Goals - 02/16/21 0819       OT SHORT TERM GOAL #1   Title Pt will be provided with  and educated on HEP to improve mobility of RUE required for daily task completion.    Time 4    Period Weeks    Status On-going    Target Date 03/13/21      OT SHORT TERM GOAL #2   Title Pt will decrease pain in RUE to 2/10 or less to improve ability to sleep in preferred position for 3+ hours without waking due to pain.    Time 4    Period Weeks    Status On-going      OT SHORT TERM GOAL #3   Title Pt will increase RUE A/ROM to WNL to improve mobility required for reaching overhead and behind back during dressing tasks.    Time 4    Period Weeks    Status On-going      OT SHORT TERM GOAL #4   Title Pt will increase RUE strength to 5/5 throughout to improve ability to play hockey and perform lifting tasks.    Time 4    Period Weeks    Status On-going                      Plan - 02/16/21 8119     Clinical Impression Statement A: Pt reports his shoulder has been feeling great, did wake up with some mild pain last night after laying on the shoulder. Session focusing on shoulder stability work, pt completing A/ROM, therapy ball exercises, ball on wall, and scapular theraband tasks. Also completed shoulder stretches. Verbal cuing for form and technique. Updated HEP.    Body Structure / Function / Physical Skills ADL;Endurance;UE functional use;Fascial restriction;Pain;ROM;IADL;Strength    Plan P: Follow up on HEP, continue with shoulder stability work, trial body blade    OT Home Exercise Plan eval: Shoulder stretches; 12/13: A/ROM, green scapular theraband    Consulted and Agree with Plan of Care Patient             Patient will benefit from skilled therapeutic intervention in order to improve the following deficits and impairments:   Body Structure / Function / Physical Skills: ADL, Endurance,  UE functional use, Fascial restriction, Pain, ROM, IADL, Strength       Visit Diagnosis: Acute pain of right shoulder  Stiffness of right shoulder, not elsewhere  classified  Other symptoms and signs involving the musculoskeletal system    Problem List Patient Active Problem List   Diagnosis Date Noted   Closed fracture of medial malleolus 12/17/2018   PCP NOTES >>>>>>>>>>>>>>>>> 08/05/2015   Insomnia 08/05/2015   Metatarsalgia of both feet 10/17/2013   Allergic rhinitis 04/26/2012   Annual physical exam 11/16/2010   BACK PAIN 05/12/2009   TB SKIN TEST, POSITIVE, HX OF 09/04/2006    Guadelupe Sabin, OTR/L  [phone] 02/16/2021, 8:55 AM  [place], [place], [address] Phone: [phone]   Fax:  [phone]  Name: Jorge Humphrey MRN: [id number] Date of Birth: [birth year]

## 2021-02-16 NOTE — Patient Instructions (Signed)
Repeat all exercises 10-15 times, 1-2 times per day.  1) Shoulder Protraction    Begin with elbows by your side, slowly "punch" straight out in front of you.      2) Shoulder Flexion  Standing:         Begin with arms at your side with thumbs pointed up, slowly raise both arms up and forward towards overhead.               3) Horizontal abduction/adduction  Standing:           Begin with arms straight out in front of you, bring out to the side in at "T" shape. Keep arms straight entire time.                 4) Internal & External Rotation   Standing:     Stand with elbows at the side and elbows bent 90 degrees. Move your forearms away from your body, then bring back inward toward the body.     5) Shoulder Abduction  Standing:       Lying on your back begin with your arms flat on the table next to your side. Slowly move your arms out to the side so that they go overhead, in a jumping jack or snow angel movement.      Theraband Exercises: Complete 10X each, 1-2X/day  1) (Home) Extension: Isometric / Bilateral Arm Retraction - Sitting   Facing anchor, hold hands and elbow at shoulder height, with elbow bent.  Pull arms back to squeeze shoulder blades together. Repeat 10-15 times. 1-3 times/day.   2) (Clinic) Extension / Flexion (Assist)   Face anchor, pull arms back, keeping elbow straight, and squeze shoulder blades together. Repeat 10-15 times. 1-3 times/day.   Copyright  VHI. All rights reserved.   3) (Home) Retraction: Row - Bilateral (Anchor)   Facing anchor, arms reaching forward, pull hands toward stomach, keeping elbows bent and at your sides and pinching shoulder blades together. Repeat 10-15 times. 1-3 times/day.   Copyright  VHI. All rights reserved.

## 2021-02-24 ENCOUNTER — Other Ambulatory Visit: Payer: Self-pay

## 2021-02-24 ENCOUNTER — Encounter (HOSPITAL_COMMUNITY): Payer: Self-pay | Admitting: Occupational Therapy

## 2021-02-24 ENCOUNTER — Ambulatory Visit (HOSPITAL_COMMUNITY): Payer: 59 | Admitting: Occupational Therapy

## 2021-02-24 DIAGNOSIS — R29898 Other symptoms and signs involving the musculoskeletal system: Secondary | ICD-10-CM | POA: Diagnosis not present

## 2021-02-24 DIAGNOSIS — M25511 Pain in right shoulder: Secondary | ICD-10-CM

## 2021-02-24 DIAGNOSIS — M25611 Stiffness of right shoulder, not elsewhere classified: Secondary | ICD-10-CM

## 2021-02-24 NOTE — Patient Instructions (Signed)
Complete 10X each, 1-2x/day  1) Wall slide: Place an elastic band around your arms at the level of your wrists as shown. Next, place your forearms and hands along a wall so that your elbows are bent and your arms point towards the ceiling.  Then, protract your shoulder blades forward and then slide your arms up the wall as shown.   2) Lateral Wall Walks: With hands against wall, walk or slide your hands to the side against resistance of the band.   3) Upward/Diagonal Wall Walks: Walk or slide your hand up the wall in a diagonal direction like a clock, going against the resistance of the band.

## 2021-02-24 NOTE — Therapy (Signed)
[facility] [place], [place], [address] Phone: [phone]   Fax:  [phone]  Occupational Therapy Treatment  Patient Details  Name: Jorge Humphrey MRN: [id number] Date of Birth: 12-14-1955 Referring Provider (OT): Dr. Netta Cedars   Encounter Date: 02/24/2021   OT End of Session - 02/24/21 0903     Visit Number 3    Number of Visits 4    Date for OT Re-Evaluation 03/13/21    Authorization Type [employer]    Authorization Time Period medical necessity review after 25 visits    Authorization - Visit Number 3    Authorization - Number of Visits 25    OT Start Time 0815    OT Stop Time 0858    OT Time Calculation (min) 43 min    Activity Tolerance Patient tolerated treatment well    Behavior During Therapy [facility] for tasks assessed/performed             Past Medical History:  Diagnosis Date   Closed disp fracture of left medial malleolus with routine healing    PPD positive    h/o   RBBB     Past Surgical History:  Procedure Laterality Date   LIPOMA EXCISION     R leg    ORIF ANKLE FRACTURE Left 11/29/2018   Procedure: Open reduction internal fixation left medial malleolus fracture;  Surgeon: Wylene Simmer, MD;  Location: [facility];  Service: Orthopedics;  Laterality: Left;   TONSILLECTOMY     TRIGGER FINGER RELEASE Right 2019   two finger, local anesthesia, Dr Amedeo Plenty   VASECTOMY      There were no vitals filed for this visit.   Subjective Assessment - 02/24/21 0813     Subjective  S: I did a lot of work this weekend.    Currently in Pain? No/denies                [facility] OT Assessment - 02/24/21 4034       Assessment   Medical Diagnosis right shoulder pain      Precautions   Precautions None                      OT Treatments/Exercises (OP) - 02/24/21 0820       Exercises   Exercises Shoulder      Shoulder Exercises: Standing   Protraction  Strengthening;10 reps    Protraction Weight (lbs) 2    Horizontal ABduction Strengthening;10 reps    Horizontal ABduction Weight (lbs) 2    External Rotation Strengthening;10 reps    External Rotation Weight (lbs) 2    Internal Rotation Strengthening;10 reps    Internal Rotation Weight (lbs) 2    Flexion Strengthening;10 reps    Shoulder Flexion Weight (lbs) 2    ABduction Strengthening;10 reps    Shoulder ABduction Weight (lbs) 2    Other Standing Exercises red loop band: wall slides with lift off, lateral wall slides, diagonal wall slides, 10X each      Shoulder Exercises: Therapy Ball   Other Therapy Ball Exercises coral therapy ball: chest presss, flexion, overhead press, circles each direction, 12X each      Shoulder Exercises: ROM/Strengthening   UBE (Upper Arm Bike) Level 4 3' foward 3' reverse, pace: 17.5    X to V Arms 10X, 2#    Proximal Shoulder Strengthening, Seated 10X each, 2#, no rest breaks    Ball on  Wall 1' flexion 1' abduction red ball      Shoulder Exercises: Stretch   Corner Stretch 2 reps;20 seconds    Internal Rotation Stretch 2 reps   20" vertical towel   External Rotation Stretch 1 rep;20 seconds    Wall Stretch - Flexion 2 reps;20 seconds    Wall Stretch - ABduction 2 reps;20 seconds      Shoulder Exercises: Body Blade   Flexion 5 reps   10" hold at end range on last rep   ABduction 5 reps   10" hold at end range on last rep   External Rotation 5 reps   10" hold at end range on last rep                   OT Education - 02/24/21 0848     Education Details red loop band exercises    Person(s) Educated Patient    Methods Explanation;Demonstration;Handout    Comprehension Verbalized understanding;Returned demonstration              OT Short Term Goals - 02/16/21 0819       OT SHORT TERM GOAL #1   Title Pt will be provided with and educated on HEP to improve mobility of RUE required for daily task completion.    Time 4    Period  Weeks    Status On-going    Target Date 03/13/21      OT SHORT TERM GOAL #2   Title Pt will decrease pain in RUE to 2/10 or less to improve ability to sleep in preferred position for 3+ hours without waking due to pain.    Time 4    Period Weeks    Status On-going      OT SHORT TERM GOAL #3   Title Pt will increase RUE A/ROM to WNL to improve mobility required for reaching overhead and behind back during dressing tasks.    Time 4    Period Weeks    Status On-going      OT SHORT TERM GOAL #4   Title Pt will increase RUE strength to 5/5 throughout to improve ability to play hockey and perform lifting tasks.    Time 4    Period Weeks    Status On-going                      Plan - 02/24/21 0855     Clinical Impression Statement A: Pt reports completing a lot of heavy work over the weekend as well as playing hockey, minimal pain/discomfort, does have some tenderness in the shoulder. Began session with shoulder stretches, continued with shoulder and scapular strengthening and stability work. Added 2# weights during shoulder strengthening, completed bodyblade with good form and endurance. Pt reports some discomfort during abduction when using free weights today. Added red loop band for additional scapular stability work today. Intermittent verbal cuing during session for form and technique.    Body Structure / Function / Physical Skills ADL;Endurance;UE functional use;Fascial restriction;Pain;ROM;IADL;Strength    Plan P: continue with shoulder stability work-trial planks/modified planks    OT Home Exercise Plan eval: Shoulder stretches; 12/13: A/ROM, green scapular theraband; 12/21: red loop band exercises    Consulted and Agree with Plan of Care Patient             Patient will benefit from skilled therapeutic intervention in order to improve the following deficits and impairments:   Body Structure / Function / Physical  Skills: ADL, Endurance, UE functional use, Fascial  restriction, Pain, ROM, IADL, Strength       Visit Diagnosis: Acute pain of right shoulder  Stiffness of right shoulder, not elsewhere classified  Other symptoms and signs involving the musculoskeletal system    Problem List Patient Active Problem List   Diagnosis Date Noted   Closed fracture of medial malleolus 12/17/2018   PCP NOTES >>>>>>>>>>>>>>>>> 08/05/2015   Insomnia 08/05/2015   Metatarsalgia of both feet 10/17/2013   Allergic rhinitis 04/26/2012   Annual physical exam 11/16/2010   BACK PAIN 05/12/2009   TB SKIN TEST, POSITIVE, HX OF 09/04/2006    Guadelupe Sabin, OTR/L  [phone] 02/24/2021, 9:03 AM  [place], [place], [address] Phone: [phone]   Fax:  [phone]  Name: Jorge Humphrey MRN: [id number] Date of Birth: 1955-11-03

## 2021-03-04 ENCOUNTER — Ambulatory Visit (HOSPITAL_COMMUNITY): Payer: 59 | Admitting: Occupational Therapy

## 2021-03-10 ENCOUNTER — Ambulatory Visit (HOSPITAL_COMMUNITY): Payer: 59 | Admitting: Occupational Therapy

## 2021-03-16 ENCOUNTER — Encounter (HOSPITAL_COMMUNITY): Payer: Self-pay

## 2021-03-16 ENCOUNTER — Other Ambulatory Visit: Payer: Self-pay

## 2021-03-16 ENCOUNTER — Ambulatory Visit (HOSPITAL_COMMUNITY): Payer: 59 | Attending: Orthopedic Surgery

## 2021-03-16 DIAGNOSIS — M25611 Stiffness of right shoulder, not elsewhere classified: Secondary | ICD-10-CM | POA: Insufficient documentation

## 2021-03-16 DIAGNOSIS — M25511 Pain in right shoulder: Secondary | ICD-10-CM | POA: Diagnosis not present

## 2021-03-16 DIAGNOSIS — R29898 Other symptoms and signs involving the musculoskeletal system: Secondary | ICD-10-CM | POA: Insufficient documentation

## 2021-03-16 NOTE — Therapy (Signed)
[facility], [place], [address] Phone: [phone]   Fax:  [phone]  Occupational Therapy Treatment Reassessment and discharge summary Patient Details  Name: Jorge Humphrey MRN: [id number] Date of Birth: 1956/02/24 Referring Provider (OT): Dr. Netta Cedars   Encounter Date: 03/16/2021   OT End of Session - 03/16/21 0939     Visit Number 4    Number of Visits 4    Authorization Type [employer]    Authorization Time Period medical necessity review after 25 visits    Authorization - Visit Number 4    Authorization - Number of Visits 25    OT Start Time 0818   reassess and discharge   OT Stop Time [phone]    OT Time Calculation (min) 20 min    Activity Tolerance Patient tolerated treatment well    Behavior During Therapy [facility] for tasks assessed/performed             Past Medical History:  Diagnosis Date   Closed disp fracture of left medial malleolus with routine healing    PPD positive    h/o   RBBB     Past Surgical History:  Procedure Laterality Date   LIPOMA EXCISION     R leg    ORIF ANKLE FRACTURE Left 11/29/2018   Procedure: Open reduction internal fixation left medial malleolus fracture;  Surgeon: Wylene Simmer, MD;  Location: [facility];  Service: Orthopedics;  Laterality: Left;   TONSILLECTOMY     TRIGGER FINGER RELEASE Right 2019   two finger, local anesthesia, Dr Amedeo Plenty   VASECTOMY      There were no vitals filed for this visit.   Subjective Assessment - 03/16/21 0821     Subjective  S: I played Hockey on Sunday. It felt a little stiff from that but I didn't have any pain.    Currently in Pain? No/denies                [facility] OT Assessment - 03/16/21 0001       Assessment   Medical Diagnosis right shoulder pain      Precautions   Precautions None      Observation/Other Assessments   Focus on Therapeutic Outcomes (FOTO)  100/100      ROM / Strength    AROM / PROM / Strength AROM;Strength      AROM   Overall AROM Comments Assessed seated, er/IR adducted    AROM Assessment Site Shoulder    Right/Left Shoulder Right    Right Shoulder Flexion 155 Degrees   preivous: 151   Right Shoulder ABduction 160 Degrees   previous: 160   Right Shoulder Internal Rotation 90 Degrees   previous; same   Right Shoulder External Rotation 77 Degrees   previous: 65     Strength   Overall Strength Comments Assessed seated, er/IR adducted    Strength Assessment Site Shoulder    Right/Left Shoulder Right    Right Shoulder Flexion 5/5   previous: same   Right Shoulder ABduction 5/5   previous: 4/5   Right Shoulder Internal Rotation 5/5   prevous: samd   Right Shoulder External Rotation 5/5   previous: same                             OT Education - 03/16/21 0938     Education Details Continue with scapular strengthening using  red band loop. Provided green band for progression when needed. Continue with shoulder stretches as needed. Slowly progress strengthening with weights.    Person(s) Educated Patient    Methods Explanation    Comprehension Verbalized understanding              OT Short Term Goals - 03/16/21 0825       OT SHORT TERM GOAL #1   Title Pt will be provided with and educated on HEP to improve mobility of RUE required for daily task completion.    Time 4    Period Weeks    Status Achieved    Target Date 03/13/21      OT SHORT TERM GOAL #2   Title Pt will decrease pain in RUE to 2/10 or less to improve ability to sleep in preferred position for 3+ hours without waking due to pain.    Time 4    Period Weeks    Status Achieved      OT SHORT TERM GOAL #3   Title Pt will increase RUE A/ROM to WNL to improve mobility required for reaching overhead and behind back during dressing tasks.    Time 4    Period Weeks    Status Achieved      OT SHORT TERM GOAL #4   Title Pt will increase RUE strength to 5/5  throughout to improve ability to play hockey and perform lifting tasks.    Time 4    Period Weeks    Status Achieved                      Plan - 03/16/21 0940     Clinical Impression Statement A: Reassessment completed this date. Patient reports that he was able to sleep without pain. He was able to play hockey on Sunday without pain. Strength is 5/5 and A/ROM is WNL. All goals have been met. Reviewed HEP and recommendations were made (see education). Pt is aware to slowly progress weight training at home.    Body Structure / Function / Physical Skills ADL;Endurance;UE functional use;Fascial restriction;Pain;ROM;IADL;Strength    Plan P: D/C from OT services with HEP.    OT Home Exercise Plan eval: Shoulder stretches; 12/13: A/ROM, green scapular theraband; 12/21: red loop band exercises 1/10: provided green loop band.    Consulted and Agree with Plan of Care Patient             Patient will benefit from skilled therapeutic intervention in order to improve the following deficits and impairments:   Body Structure / Function / Physical Skills: ADL, Endurance, UE functional use, Fascial restriction, Pain, ROM, IADL, Strength       Visit Diagnosis: Acute pain of right shoulder  Stiffness of right shoulder, not elsewhere classified  Other symptoms and signs involving the musculoskeletal system    Problem List Patient Active Problem List   Diagnosis Date Noted   Closed fracture of medial malleolus 12/17/2018   PCP NOTES >>>>>>>>>>>>>>>>> 08/05/2015   Insomnia 08/05/2015   Metatarsalgia of both feet 10/17/2013   Allergic rhinitis 04/26/2012   Annual physical exam 11/16/2010   BACK PAIN 05/12/2009   TB SKIN TEST, POSITIVE, HX OF 09/04/2006   OCCUPATIONAL THERAPY DISCHARGE SUMMARY  Visits from Start of Care: 4  Current functional level related to goals / functional outcomes: See above   Remaining deficits: See above   Education / Equipment: See above    Patient agrees to discharge. Patient goals were met.  Patient is being discharged due to meeting the stated rehab goals.Ailene Ravel, OTR/L,CBIS  [phone]  03/16/2021, 9:44 AM  [facility] [address] [place], [place], [address] Phone: [phone]   Fax:  [phone]  Name: Jorge Humphrey MRN: [id number] Date of Birth: [birth year]

## 2021-03-23 ENCOUNTER — Other Ambulatory Visit (HOSPITAL_BASED_OUTPATIENT_CLINIC_OR_DEPARTMENT_OTHER): Payer: Self-pay

## 2021-03-30 ENCOUNTER — Ambulatory Visit (INDEPENDENT_AMBULATORY_CARE_PROVIDER_SITE_OTHER): Payer: 59 | Admitting: Internal Medicine

## 2021-03-30 ENCOUNTER — Encounter: Payer: Self-pay | Admitting: Internal Medicine

## 2021-03-30 VITALS — BP 124/70 | HR 50 | Temp 97.9°F | Resp 16 | Ht 69.0 in | Wt 160.5 lb

## 2021-03-30 DIAGNOSIS — D509 Iron deficiency anemia, unspecified: Secondary | ICD-10-CM | POA: Diagnosis not present

## 2021-03-30 DIAGNOSIS — Z0000 Encounter for general adult medical examination without abnormal findings: Secondary | ICD-10-CM | POA: Diagnosis not present

## 2021-03-30 LAB — COMPREHENSIVE METABOLIC PANEL
ALT: 26 U/L (ref 0–53)
AST: 28 U/L (ref 0–37)
Albumin: 4.3 g/dL (ref 3.5–5.2)
Alkaline Phosphatase: 55 U/L (ref 39–117)
BUN: 17 mg/dL (ref 6–23)
CO2: 29 mEq/L (ref 19–32)
Calcium: 9.3 mg/dL (ref 8.4–10.5)
Chloride: 105 mEq/L (ref 96–112)
Creatinine, Ser: 1.12 mg/dL (ref 0.40–1.50)
GFR: 69.8 mL/min (ref >60.00)
Glucose, Bld: 84 mg/dL (ref 70–99)
Potassium: 4.6 mEq/L (ref 3.5–5.1)
Sodium: 140 mEq/L (ref 135–145)
Total Bilirubin: 0.7 mg/dL (ref 0.2–1.2)
Total Protein: 6.3 g/dL (ref 6.0–8.3)

## 2021-03-30 LAB — LIPID PANEL
Cholesterol: 162 mg/dL (ref 0–200)
HDL: 55.1 mg/dL (ref >39.00)
LDL Cholesterol: 99 mg/dL (ref 0–99)
NonHDL: 107.02
Total CHOL/HDL Ratio: 3
Triglycerides: 42 mg/dL (ref 0.0–149.0)
VLDL: 8.4 mg/dL (ref 0.0–40.0)

## 2021-03-30 LAB — HEMOGLOBIN A1C: Hgb A1c MFr Bld: 5.7 % (ref 4.6–6.5)

## 2021-03-30 LAB — CBC WITH DIFFERENTIAL/PLATELET
Basophils Absolute: 0 10*3/uL (ref 0.0–0.1)
Basophils Relative: 0.9 % (ref 0.0–3.0)
Eosinophils Absolute: 0.1 10*3/uL (ref 0.0–0.7)
Eosinophils Relative: 2.9 % (ref 0.0–5.0)
HCT: 38 % — ABNORMAL LOW (ref 39.0–52.0)
Hemoglobin: 12.6 g/dL — ABNORMAL LOW (ref 13.0–17.0)
Lymphocytes Relative: 25.4 % (ref 12.0–46.0)
Lymphs Abs: 1.3 10*3/uL (ref 0.7–4.0)
MCHC: 33.2 g/dL (ref 30.0–36.0)
MCV: 90 fl (ref 78.0–100.0)
Monocytes Absolute: 0.7 10*3/uL (ref 0.1–1.0)
Monocytes Relative: 14.2 % — ABNORMAL HIGH (ref 3.0–12.0)
Neutro Abs: 2.9 10*3/uL (ref 1.4–7.7)
Neutrophils Relative %: 56.6 % (ref 43.0–77.0)
Platelets: 196 10*3/uL (ref 150.0–400.0)
RBC: 4.22 Mil/uL (ref 4.22–5.81)
RDW: 14.8 % (ref 11.5–15.5)
WBC: 5.1 10*3/uL (ref 4.0–10.5)

## 2021-03-30 LAB — PSA: PSA: 1.59 ng/mL (ref 0.10–4.00)

## 2021-03-30 NOTE — Assessment & Plan Note (Signed)
-  Td 2019 -COVID vac- UTD - flu shot at work   - s/p shingrex 2  --CCS: Dr Perry--nml--next 2024  -Prostate cancer screening: DRE normal, no symptoms, check PSA -Lifestyle: Active, playing hockey, goes to the gym.  Eats healthy. -He is 10-year CV RF is 10.3%.based on last FLP, no FH, does qualify for meds, he would be hesitant to start statins in the near future. -Labs: CMP, FLP,CBC, A1c, PSA -ACP discussed

## 2021-03-30 NOTE — Patient Instructions (Signed)
The 10-year ASCVD risk score (Arnett DK, et al., 2019) is: 10.3%   Values used to calculate the score:     Age: 66 years     Sex: Male     Is Non-Hispanic African American: No     Diabetic: No     Tobacco smoker: No     Systolic Blood Pressure: 956 mmHg     Is BP treated: No     HDL Cholesterol: 46.1 mg/dL     Total Cholesterol: 185 mg/dL    GO TO THE LAB : Get the blood work     GO TO THE FRONT DESK, PLEASE SCHEDULE YOUR APPOINTMENTS Come back for a physical exam in 1 year.    "Living will", "[place] of attorney": Advanced care planning  (If you already have a living will or healthcare power of attorney, please bring the copy to be scanned in your chart.)  Advance care planning is a process that supports adults in  understanding and sharing their preferences regarding future medical care.   The patient's preferences are recorded in documents called Advance Directives.    Advanced directives are completed (and can be modified at any time) while the patient is in full mental capacity.   The documentation should be available at all times to the patient, the family and the healthcare providers.  Bring in a copy to be scanned in your chart is an excellent idea and is recommended   This legal documents direct treatment decision making and/or appoint a surrogate to make the decision if the patient is not capable to do so.    Advance directives can be documented in many types of formats,  documents have names such as:  Lliving will  Durable power of attorney for healthcare (healthcare proxy or healthcare power of attorney)  Combined directives  Physician orders for life-sustaining treatment    More information at:  [email or web address]

## 2021-03-30 NOTE — Assessment & Plan Note (Signed)
Here for CPX Hyperglycemia: Check A1c Insomnia-anxiety: Still having stress, marital related, well managed with Xanax at bedtime as needed, does not take frequently.  RF as needed. FH melanoma: Sees dermatology regularly Had COVID during Christmas 2022, feels fully recuperated RTC 1 year, sooner if needed

## 2021-03-30 NOTE — Progress Notes (Signed)
Subjective:    Patient ID: Jorge Humphrey, male    DOB: [birth year], 66 y.o.   MRN: [id number]  DOS:  03/30/2021 Type of visit - description: CPX  Here for CPX Had right shoulder pain, doing better. Anxiety insomnia: Still an issue, well managed with Xanax as needed only.   Review of Systems  Other than above, a 14 point review of systems is negative      Past Medical History:  Diagnosis Date   Closed disp fracture of left medial malleolus with routine healing    PPD positive    h/o   RBBB     Past Surgical History:  Procedure Laterality Date   LIPOMA EXCISION     R leg    ORIF ANKLE FRACTURE Left 11/29/2018   Procedure: Open reduction internal fixation left medial malleolus fracture;  Surgeon: Wylene Simmer, MD;  Location: [facility];  Service: Orthopedics;  Laterality: Left;   TONSILLECTOMY     TRIGGER FINGER RELEASE Right 2019   two finger, local anesthesia, Dr Amedeo Plenty   VASECTOMY     Social History   Socioeconomic History   Marital status: Married    Spouse name: Not on file   Number of children: 3   Years of education: Not on file   Highest education level: Not on file  Occupational History   Occupation: Therapist, sports    Employer:   Tobacco Use   Smoking status: Former    Types: Cigarettes    Quit date: 03/07/1978    Years since quitting: 43.0   Smokeless tobacco: Never  Vaping Use   Vaping Use: Never used  Substance and Sexual Activity   Alcohol use: Yes    Comment: social   Drug use: No   Sexual activity: Not on file  Other Topics Concern   Not on file  Social History Narrative   Patient is from [place], [place] from [place].   Patient is a Marine scientist,  Works at  [place] son- [place]   Daughter in [place] son: [place]    Social Determinants of Radio broadcast assistant Strain: Not on [employer] Insecurity: Not on file  Transportation Needs: Not on file  Physical Activity: Not  on file  Stress: Not on file  Social Connections: Not on file  Intimate Partner Violence: Not on file     Current Outpatient Medications  Medication Instructions   ALPRAZolam (XANAX) 0.5 mg, Oral, At bedtime PRN   cetirizine (ZYRTEC) 10 mg, Oral, Daily PRN   fluocinonide (LIDEX) 0.05 % external solution Topical, 2 times daily PRN   Multiple Vitamin (MULTIVITAMIN WITH MINERALS) TABS tablet 1 tablet, Oral, Daily       Objective:   Physical Exam BP 124/70 (BP Location: Left Arm, Patient Position: Sitting, Cuff Size: Small)    Pulse (!) 50    Temp 97.9 F (36.6 C) (Oral)    Resp 16    Ht 5\' 9"  (1.753 m)    Wt 160 lb 8 oz (72.8 kg)    SpO2 99%    BMI 23.70 kg/m  General: Well developed, NAD, BMI noted Neck: No  thyromegaly  HEENT:  Normocephalic . Face symmetric, atraumatic Lungs:  CTA B Normal respiratory effort, no intercostal retractions, no accessory muscle use. Heart: RRR,  no murmur.  Abdomen:  Not distended, soft, non-tender. No rebound or rigidity. DRE: Normal sphincter tone, brown stools, prostate  is slightly enlarged but not tender or nodular Lower extremities: no pretibial edema bilaterally  Skin: Exposed areas without rash. Not pale. Not jaundice Neurologic:  alert & oriented X3.  Speech normal, gait appropriate for age and unassisted Strength symmetric and appropriate for age.  Psych: Cognition and judgment appear intact.  Cooperative with normal attention span and concentration.  Behavior appropriate. No anxious or depressed appearing.     Assessment     Assessment A1C 6.0 (2009) Insomnia- anxiety  History + PPD, s/p treatment +FH melanoma: sees derm q year Bradycardia: Chronic, asymptomatic  PLAN: Here for CPX Hyperglycemia: Check A1c Insomnia-anxiety: Still having stress, marital related, well managed with Xanax at bedtime as needed, does not take frequently.  RF as needed. FH melanoma: Sees dermatology regularly Had COVID during Christmas 2022,  feels fully recuperated RTC 1 year, sooner if needed    This visit occurred during the SARS-CoV-2 public health emergency.  Safety protocols were in place, including screening questions prior to the visit, additional usage of staff PPE, and extensive cleaning of exam room while observing appropriate contact time as indicated for disinfecting solutions.

## 2021-03-31 ENCOUNTER — Other Ambulatory Visit (INDEPENDENT_AMBULATORY_CARE_PROVIDER_SITE_OTHER): Payer: 59

## 2021-03-31 DIAGNOSIS — D649 Anemia, unspecified: Secondary | ICD-10-CM | POA: Diagnosis not present

## 2021-03-31 LAB — B12 AND FOLATE PANEL
Folate: >24.2 ng/mL (ref >5.9)
Vitamin B-12: 886 pg/mL (ref 211–911)

## 2021-04-01 LAB — IRON,TIBC AND FERRITIN PANEL
%SAT: 30 % (calc) (ref 20–48)
Ferritin: 20 ng/mL — ABNORMAL LOW (ref 24–380)
Iron: 103 ug/dL (ref 50–180)
TIBC: 344 mcg/dL (calc) (ref 250–425)

## 2021-04-02 ENCOUNTER — Encounter: Payer: Self-pay | Admitting: Internal Medicine

## 2021-04-02 DIAGNOSIS — D509 Iron deficiency anemia, unspecified: Secondary | ICD-10-CM

## 2021-04-02 NOTE — Addendum Note (Signed)
Addended byDamita Dunnings D on: 04/02/2021 07:40 AM   Modules accepted: Orders

## 2021-04-29 DIAGNOSIS — H5203 Hypermetropia, bilateral: Secondary | ICD-10-CM | POA: Diagnosis not present

## 2021-04-29 DIAGNOSIS — H52223 Regular astigmatism, bilateral: Secondary | ICD-10-CM | POA: Diagnosis not present

## 2021-05-04 ENCOUNTER — Encounter: Payer: Self-pay | Admitting: Internal Medicine

## 2021-06-10 ENCOUNTER — Other Ambulatory Visit (INDEPENDENT_AMBULATORY_CARE_PROVIDER_SITE_OTHER): Payer: 59

## 2021-06-10 DIAGNOSIS — D509 Iron deficiency anemia, unspecified: Secondary | ICD-10-CM

## 2021-06-10 DIAGNOSIS — L570 Actinic keratosis: Secondary | ICD-10-CM | POA: Diagnosis not present

## 2021-06-10 LAB — CBC WITH DIFFERENTIAL/PLATELET
Basophils Absolute: 0.1 10*3/uL (ref 0.0–0.1)
Basophils Relative: 1.2 % (ref 0.0–3.0)
Eosinophils Absolute: 0.1 10*3/uL (ref 0.0–0.7)
Eosinophils Relative: 3 % (ref 0.0–5.0)
HCT: 40.4 % (ref 39.0–52.0)
Hemoglobin: 13.5 g/dL (ref 13.0–17.0)
Lymphocytes Relative: 32 % (ref 12.0–46.0)
Lymphs Abs: 1.6 10*3/uL (ref 0.7–4.0)
MCHC: 33.4 g/dL (ref 30.0–36.0)
MCV: 88.6 fl (ref 78.0–100.0)
Monocytes Absolute: 0.6 10*3/uL (ref 0.1–1.0)
Monocytes Relative: 12.3 % — ABNORMAL HIGH (ref 3.0–12.0)
Neutro Abs: 2.5 10*3/uL (ref 1.4–7.7)
Neutrophils Relative %: 51.5 % (ref 43.0–77.0)
Platelets: 174 10*3/uL (ref 150.0–400.0)
RBC: 4.56 Mil/uL (ref 4.22–5.81)
RDW: 13.8 % (ref 11.5–15.5)
WBC: 4.9 10*3/uL (ref 4.0–10.5)

## 2021-06-10 LAB — FERRITIN: Ferritin: 9.4 ng/mL — ABNORMAL LOW (ref 22.0–322.0)

## 2021-06-10 LAB — IRON: Iron: 70 ug/dL (ref 42–165)

## 2021-06-14 ENCOUNTER — Other Ambulatory Visit (HOSPITAL_COMMUNITY): Payer: Self-pay

## 2021-06-14 ENCOUNTER — Encounter: Payer: Self-pay | Admitting: Internal Medicine

## 2021-06-14 MED ORDER — FERROUS FUMARATE 325 (106 FE) MG PO TABS
1.0000 | ORAL_TABLET | Freq: Two times a day (BID) | ORAL | 1 refills | Status: DC
  Filled 2021-06-14 – 2021-08-19 (×2): qty 180, 90d supply, fill #0

## 2021-06-14 NOTE — Addendum Note (Signed)
Addended by: Ramla Hase D on: 06/14/2021 02:05 PM ? ? Modules accepted: Orders ? ?

## 2021-06-24 ENCOUNTER — Encounter: Payer: Self-pay | Admitting: Internal Medicine

## 2021-07-14 ENCOUNTER — Encounter: Payer: Self-pay | Admitting: Internal Medicine

## 2021-08-19 ENCOUNTER — Other Ambulatory Visit: Payer: Self-pay | Admitting: Internal Medicine

## 2021-08-19 ENCOUNTER — Other Ambulatory Visit (HOSPITAL_COMMUNITY): Payer: Self-pay

## 2021-08-19 NOTE — Telephone Encounter (Signed)
Requesting: alprazolam 0.'5mg'$   Contract: 10/09/19 UDS: 02/26/2020 Last Visit: 03/30/21 Next Visit: None Last Refill: 10/05/20 #30 and 1RF  Please Advise

## 2021-08-20 ENCOUNTER — Other Ambulatory Visit (HOSPITAL_COMMUNITY): Payer: Self-pay

## 2021-08-20 MED ORDER — ALPRAZOLAM 0.5 MG PO TABS
0.5000 mg | ORAL_TABLET | Freq: Every evening | ORAL | 3 refills | Status: DC | PRN
  Filled 2021-08-20: qty 30, 30d supply, fill #0
  Filled 2021-09-24: qty 30, 30d supply, fill #1
  Filled 2022-02-14: qty 30, 30d supply, fill #2

## 2021-09-24 ENCOUNTER — Other Ambulatory Visit (HOSPITAL_COMMUNITY): Payer: Self-pay

## 2021-12-20 DIAGNOSIS — H029 Unspecified disorder of eyelid: Secondary | ICD-10-CM | POA: Diagnosis not present

## 2021-12-20 DIAGNOSIS — H02831 Dermatochalasis of right upper eyelid: Secondary | ICD-10-CM | POA: Diagnosis not present

## 2021-12-20 DIAGNOSIS — H02834 Dermatochalasis of left upper eyelid: Secondary | ICD-10-CM | POA: Diagnosis not present

## 2021-12-20 DIAGNOSIS — H02401 Unspecified ptosis of right eyelid: Secondary | ICD-10-CM | POA: Diagnosis not present

## 2022-01-07 ENCOUNTER — Other Ambulatory Visit (HOSPITAL_BASED_OUTPATIENT_CLINIC_OR_DEPARTMENT_OTHER): Payer: Self-pay

## 2022-01-07 MED ORDER — COMIRNATY 30 MCG/0.3ML IM SUSY
PREFILLED_SYRINGE | INTRAMUSCULAR | 0 refills | Status: DC
  Filled 2022-01-07: qty 0.3, 1d supply, fill #0

## 2022-01-20 DIAGNOSIS — L821 Other seborrheic keratosis: Secondary | ICD-10-CM | POA: Diagnosis not present

## 2022-01-20 DIAGNOSIS — L720 Epidermal cyst: Secondary | ICD-10-CM | POA: Diagnosis not present

## 2022-01-20 DIAGNOSIS — D2271 Melanocytic nevi of right lower limb, including hip: Secondary | ICD-10-CM | POA: Diagnosis not present

## 2022-01-20 DIAGNOSIS — L578 Other skin changes due to chronic exposure to nonionizing radiation: Secondary | ICD-10-CM | POA: Diagnosis not present

## 2022-01-20 DIAGNOSIS — D225 Melanocytic nevi of trunk: Secondary | ICD-10-CM | POA: Diagnosis not present

## 2022-01-20 DIAGNOSIS — L570 Actinic keratosis: Secondary | ICD-10-CM | POA: Diagnosis not present

## 2022-01-20 DIAGNOSIS — L814 Other melanin hyperpigmentation: Secondary | ICD-10-CM | POA: Diagnosis not present

## 2022-01-20 DIAGNOSIS — L719 Rosacea, unspecified: Secondary | ICD-10-CM | POA: Diagnosis not present

## 2022-02-14 ENCOUNTER — Telehealth: Payer: Self-pay | Admitting: Internal Medicine

## 2022-02-14 NOTE — Telephone Encounter (Signed)
Nurse Assessment Nurse: Windle Guard, RN, Lesa Date/Time Eilene Ghazi Time): 02/14/2022 10:51:16 AM Confirm and document reason for call. If symptomatic, describe symptoms. ---Caller states he has tinnitus Does the patient have any new or worsening symptoms? ---Yes Will a triage be completed? ---Yes Related visit to physician within the last 2 weeks? ---No Does the PT have any chronic conditions? (i.e. diabetes, asthma, this includes High risk factors for pregnancy, etc.) ---No Is this a behavioral health or substance abuse call? ---No Guidelines Guideline Title Affirmed Question Affirmed Notes Nurse Date/Time (Eastern Time) Tinnitus Symptoms only or mainly in 1 ear (unilateral tinnitus) Conner, RN, Lesa 02/14/2022 10:52:06 AM Disp. Time Eilene Ghazi Time) Disposition Final User 02/14/2022 10:26:02 AM Attempt made - message left Conner, RN, Lesa 02/14/2022 10:55:05 AM SEE PCP WITHIN 3 DAYS Yes Conner, RN, Lesa Final Disposition 02/14/2022 10:55:05 AM SEE PCP WITHIN 3 DAYS Yes Conner, RN, Lesa PLEASE NOTE: All timestamps contained within this report are represented as [place] Standard Time. CONFIDENTIALTY NOTICE: This fax transmission is intended only for the addressee. It contains information that is legally privileged, confidential or otherwise protected from use or disclosure. If you are not the intended recipient, you are strictly prohibited from reviewing, disclosing, copying using or disseminating any of this information or taking any action in reliance on or regarding this information. If you have received this fax in error, please notify us immediately by telephone so that we can arrange for its return to [place]. Phone: [phone], Toll-Free: [phone], Fax: [phone] Page: 2 of 2 Call Id: [id number] [place] Disagree/Comply Comply Caller Understands Yes PreDisposition [place] Advice Given Per Guideline SEE PCP WITHIN 3 DAYS: * You need to be seen within 2 or 3 days. REASSURANCE  AND EDUCATION - DECREASED HEARING IN ONE EAR: * The most likely cause of gradual loss of hearing in one ear is a plug of earwax. * Treatment is not urgent.Here is some care advice that should help. AVOID LOUD NOISE: * Loud music such as a concert or loud sounds such as from a firearm (gun) can cause temporary tinnitus. Usually this goes away in a couple hours. * Listening to loud music or other loud sounds over time can cause long-lasting hearing damage. AVOID CAFFEINE, ASPIRIN, AND ALCOHOL: * These can make tinnitus worse. CALL BACK IF: * Earache occurs * You become worse CARE ADVICE given per Tinnitus (Adult) guideline

## 2022-02-14 NOTE — Telephone Encounter (Signed)
Appt scheduled tomorrow

## 2022-02-14 NOTE — Telephone Encounter (Signed)
Pt stated he has had ongoing ringing in is ears. Scheduled him for tmr and transferred to triage.

## 2022-02-15 ENCOUNTER — Ambulatory Visit: Payer: 59 | Admitting: Internal Medicine

## 2022-02-15 ENCOUNTER — Other Ambulatory Visit (HOSPITAL_COMMUNITY): Payer: Self-pay

## 2022-02-15 ENCOUNTER — Encounter: Payer: Self-pay | Admitting: Internal Medicine

## 2022-02-15 VITALS — BP 122/72 | HR 58 | Temp 97.6°F | Resp 16 | Ht 69.0 in | Wt 161.0 lb

## 2022-02-15 DIAGNOSIS — B001 Herpesviral vesicular dermatitis: Secondary | ICD-10-CM | POA: Diagnosis not present

## 2022-02-15 DIAGNOSIS — H9311 Tinnitus, right ear: Secondary | ICD-10-CM

## 2022-02-15 MED ORDER — VALACYCLOVIR HCL 1 G PO TABS
2000.0000 mg | ORAL_TABLET | Freq: Two times a day (BID) | ORAL | 0 refills | Status: DC | PRN
  Filled 2022-02-15: qty 30, 8d supply, fill #0

## 2022-02-15 MED ORDER — PREDNISONE 10 MG PO TABS
ORAL_TABLET | ORAL | 0 refills | Status: AC
  Filled 2022-02-15: qty 20, 8d supply, fill #0

## 2022-02-15 NOTE — Patient Instructions (Addendum)
For your current tinnitus take prednisone for few days  Call if not gradually better  For cold sores: As soon as you realize you are going to have cold sores, take Valtrex 2000 mg twice daily for 1 day only.  See you in February for your physical

## 2022-02-15 NOTE — Progress Notes (Unsigned)
Subjective:    Patient ID: Jorge Humphrey, male    DOB: 09-Sep-1955, 66 y.o.   MRN: [id number]  DOS:  02/15/2022 Type of visit - description: Acute  Having R side tinnitus for the last 2 weeks. Described as a "metallic clicking". He denies any ear pain, no URI. He does goes to [facility] and there is loud music there.  Just started to use earplugs which I encouraged to continue doing.  Also, has recurrent cold sores at the lower lip.  Is his observation that often times cold sores are associated with nerve type of pain located at the right temple. Interestingly, current episode of tinnitus started 2 weeks ago along w/  latest cold sore bout.  Review of Systems See above   Past Medical History:  Diagnosis Date   Closed disp fracture of left medial malleolus with routine healing    PPD positive    h/o   RBBB     Past Surgical History:  Procedure Laterality Date   LIPOMA EXCISION     R leg    ORIF ANKLE FRACTURE Left 11/29/2018   Procedure: Open reduction internal fixation left medial malleolus fracture;  Surgeon: Wylene Simmer, MD;  Location: [facility];  Service: Orthopedics;  Laterality: Left;   TONSILLECTOMY     TRIGGER FINGER RELEASE Right 2019   two finger, local anesthesia, Dr Amedeo Plenty   VASECTOMY      Current Outpatient Medications  Medication Instructions   ALPRAZolam (XANAX) 0.5 mg, Oral, At bedtime PRN   cetirizine (ZYRTEC) 10 mg, Oral, Daily PRN   ferrous fumarate (HEMOCYTE - 106 MG FE) 325 (106 Fe) MG TABS tablet 106 mg of iron, Oral, 2 times daily   fluocinonide (LIDEX) 0.05 % external solution Topical, 2 times daily PRN   Multiple Vitamin (MULTIVITAMIN WITH MINERALS) TABS tablet 1 tablet, Oral, Daily       Objective:   Physical Exam BP 122/72   Pulse (!) 58   Temp 97.6 F (36.4 C) (Oral)   Resp 16   Ht '5\' 9"'$  (1.753 m)   Wt 161 lb (73 kg)   SpO2 97%   BMI 23.78 kg/m  General:   Well developed, NAD, BMI noted. HEENT:   Normocephalic . Face symmetric, atraumatic TMs: Not red, intact, canal normal, no discharge. Lower extremities: no pretibial edema bilaterally  Skin: Not pale. Not jaundice Neurologic:  alert & oriented X3.  Speech normal, gait appropriate for age and unassisted Psych--  Cognition and judgment appear intact.  Cooperative with normal attention span and concentration.  Behavior appropriate. No anxious or depressed appearing.      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  PLAN: Tinnitus: Episode of tinnitus, started at the same time as cold sores (see next).  In the past he had good response to prednisone.  Plan: Prednisone, call if not better. Cold sores: He has cold sores on and off associated with nerve type of pain at the right temple.  Apparently this time it also triggered tinnitus. Plan: Valtrex 2000 mg twice daily for 1 day for the next cold sore episode. Anemia: Noted to have mild anemia and iron deficiency, referred to GI 04-2021, patient elected to defer GI referral for now, he thinks could be related with blood donation which he has stopped.  He is  not taking iron.   Plan: No iron supplements, no blood donations, he is coming back in February for CPX, will check a CBC iron and ferritin then.  Further advised with results. RTC February for CPX

## 2022-02-16 DIAGNOSIS — B001 Herpesviral vesicular dermatitis: Secondary | ICD-10-CM | POA: Insufficient documentation

## 2022-02-16 NOTE — Assessment & Plan Note (Signed)
Tinnitus: Episode of tinnitus, started at the same time as cold sores (see next).  In the past he had good response to prednisone.  Plan: Prednisone, call if not better. Cold sores: He has cold sores on and off associated with nerve type of pain at the right temple.  Apparently this time it also triggered tinnitus. Plan: Valtrex 2000 mg twice daily for 1 day for the next cold sore episode. Anemia: Noted to have mild anemia and iron deficiency, referred to GI 04-2021, patient elected to defer GI referral for now, he thinks could be related with blood donation which he has stopped.  He is not taking iron.   Plan: No iron supplements, no blood donations, he is coming back in February for CPX, will check a CBC iron and ferritin then.  Further advised with results. RTC February for CPX

## 2022-03-02 ENCOUNTER — Other Ambulatory Visit (HOSPITAL_COMMUNITY): Payer: Self-pay

## 2022-03-02 MED ORDER — DIAZEPAM 5 MG PO TABS
ORAL_TABLET | ORAL | 0 refills | Status: DC
  Filled 2022-03-02: qty 3, 1d supply, fill #0

## 2022-04-01 ENCOUNTER — Other Ambulatory Visit (HOSPITAL_COMMUNITY): Payer: Self-pay

## 2022-04-01 DIAGNOSIS — H029 Unspecified disorder of eyelid: Secondary | ICD-10-CM | POA: Diagnosis not present

## 2022-04-01 DIAGNOSIS — H02401 Unspecified ptosis of right eyelid: Secondary | ICD-10-CM | POA: Diagnosis not present

## 2022-04-01 HISTORY — PX: BLEPHAROPLASTY: SUR158

## 2022-04-01 MED ORDER — ERYTHROMYCIN 5 MG/GM OP OINT
TOPICAL_OINTMENT | OPHTHALMIC | 3 refills | Status: DC
  Filled 2022-04-01: qty 3.5, 14d supply, fill #0
  Filled 2022-04-19: qty 3.5, 14d supply, fill #1

## 2022-04-01 MED ORDER — TRAMADOL HCL 50 MG PO TABS
ORAL_TABLET | ORAL | 0 refills | Status: DC
  Filled 2022-04-01: qty 6, 2d supply, fill #0

## 2022-04-07 ENCOUNTER — Ambulatory Visit (INDEPENDENT_AMBULATORY_CARE_PROVIDER_SITE_OTHER): Payer: 59 | Admitting: Internal Medicine

## 2022-04-07 ENCOUNTER — Encounter: Payer: Self-pay | Admitting: Internal Medicine

## 2022-04-07 VITALS — BP 122/72 | HR 49 | Temp 97.9°F | Resp 16 | Ht 69.0 in | Wt 163.0 lb

## 2022-04-07 DIAGNOSIS — Z0000 Encounter for general adult medical examination without abnormal findings: Secondary | ICD-10-CM

## 2022-04-07 DIAGNOSIS — Z125 Encounter for screening for malignant neoplasm of prostate: Secondary | ICD-10-CM

## 2022-04-07 DIAGNOSIS — D509 Iron deficiency anemia, unspecified: Secondary | ICD-10-CM

## 2022-04-07 DIAGNOSIS — R739 Hyperglycemia, unspecified: Secondary | ICD-10-CM | POA: Diagnosis not present

## 2022-04-07 DIAGNOSIS — Z79899 Other long term (current) drug therapy: Secondary | ICD-10-CM | POA: Diagnosis not present

## 2022-04-07 DIAGNOSIS — F419 Anxiety disorder, unspecified: Secondary | ICD-10-CM

## 2022-04-07 DIAGNOSIS — E785 Hyperlipidemia, unspecified: Secondary | ICD-10-CM

## 2022-04-07 LAB — COMPREHENSIVE METABOLIC PANEL
ALT: 19 U/L (ref 0–53)
AST: 17 U/L (ref 0–37)
Albumin: 4.5 g/dL (ref 3.5–5.2)
Alkaline Phosphatase: 70 U/L (ref 39–117)
BUN: 24 mg/dL — ABNORMAL HIGH (ref 6–23)
CO2: 30 mEq/L (ref 19–32)
Calcium: 9.4 mg/dL (ref 8.4–10.5)
Chloride: 103 mEq/L (ref 96–112)
Creatinine, Ser: 0.9 mg/dL (ref 0.40–1.50)
GFR: 90.09 mL/min (ref >60.00)
Glucose, Bld: 90 mg/dL (ref 70–99)
Potassium: 4.4 mEq/L (ref 3.5–5.1)
Sodium: 139 mEq/L (ref 135–145)
Total Bilirubin: 0.4 mg/dL (ref 0.2–1.2)
Total Protein: 6.6 g/dL (ref 6.0–8.3)

## 2022-04-07 LAB — TSH: TSH: 2.24 u[IU]/mL (ref 0.35–5.50)

## 2022-04-07 LAB — LIPID PANEL
Cholesterol: 182 mg/dL (ref 0–200)
HDL: 48.1 mg/dL (ref >39.00)
LDL Cholesterol: 120 mg/dL — ABNORMAL HIGH (ref 0–99)
NonHDL: 134.11
Total CHOL/HDL Ratio: 4
Triglycerides: 72 mg/dL (ref 0.0–149.0)
VLDL: 14.4 mg/dL (ref 0.0–40.0)

## 2022-04-07 LAB — IRON: Iron: 79 ug/dL (ref 42–165)

## 2022-04-07 LAB — CBC WITH DIFFERENTIAL/PLATELET
Basophils Absolute: 0.1 10*3/uL (ref 0.0–0.1)
Basophils Relative: 1.2 % (ref 0.0–3.0)
Eosinophils Absolute: 0.2 10*3/uL (ref 0.0–0.7)
Eosinophils Relative: 3.8 % (ref 0.0–5.0)
HCT: 43.9 % (ref 39.0–52.0)
Hemoglobin: 14.9 g/dL (ref 13.0–17.0)
Lymphocytes Relative: 23.9 % (ref 12.0–46.0)
Lymphs Abs: 1.3 10*3/uL (ref 0.7–4.0)
MCHC: 33.9 g/dL (ref 30.0–36.0)
MCV: 89.4 fl (ref 78.0–100.0)
Monocytes Absolute: 0.7 10*3/uL (ref 0.1–1.0)
Monocytes Relative: 12 % (ref 3.0–12.0)
Neutro Abs: 3.2 10*3/uL (ref 1.4–7.7)
Neutrophils Relative %: 59.1 % (ref 43.0–77.0)
Platelets: 166 10*3/uL (ref 150.0–400.0)
RBC: 4.92 Mil/uL (ref 4.22–5.81)
RDW: 14.2 % (ref 11.5–15.5)
WBC: 5.4 10*3/uL (ref 4.0–10.5)

## 2022-04-07 LAB — FERRITIN: Ferritin: 32.5 ng/mL (ref 22.0–322.0)

## 2022-04-07 LAB — HEMOGLOBIN A1C: Hgb A1c MFr Bld: 5.8 % (ref 4.6–6.5)

## 2022-04-07 LAB — PSA: PSA: 0.84 ng/mL (ref 0.10–4.00)

## 2022-04-07 NOTE — Patient Instructions (Signed)
You are due for a colonoscopy May 2024.   GO TO THE LAB : Get the blood work     [place], [place] Come back for   physical exam in 1 year      "[place] of attorney" ,  "Living will" (Advance care planning documents)  If you already have a living will or healthcare power of attorney, is recommended you bring the copy to be scanned in your chart.   The document will be available to all the doctors you see in the system.  Advance care planning is a process that supports adults in  understanding and sharing their preferences regarding future medical care.  The patient's preferences are recorded in documents called Advance Directives and the can be modified at any time while the patient is in full mental capacity.   If you don't have one, please consider create one.      More information at: [email or web address]

## 2022-04-07 NOTE — Assessment & Plan Note (Signed)
-  Td 2019 -COVID vac- UTD - flu shot at work   - s/p shingrex 2  --CCS: Dr Perry--nml--next 07-2022  -Prostate cancer screening: DRE normal last year, check PSA -Lifestyle: Active, playing hockey, goes to the gym -boot camp x 3-4/w.  Eats healthy. -CV RF 8.8%, we are rechecking a cholesterol today.  He will be somewhat hesitant to take cholesterol medication, we could order coronary calcium score if needed, patient will be agreeable to that. -Labs: CMP FLP CBC iron ferritin TSH PSA A1c -ACP discussed

## 2022-04-07 NOTE — Assessment & Plan Note (Signed)
Here for CPX IDA: See last note, he had iron deficiency anemia, he felt it was due to blood donation.  At this point he is not donating blood, no GI symptoms, will check a CBC, iron ferritin.  Further advised with results.  Due for colonoscopy in May, patient aware. Tinnitus (R): See last visit, symptoms quickly resolved with prednisone, no HOH, now have very minimal tinnitus on and off.  We talk about possibly an audiology referral but he declined. RTC 1 year

## 2022-04-07 NOTE — Progress Notes (Signed)
Subjective:    Patient ID: Jorge Humphrey, male    DOB: Apr 26, 1955, 67 y.o.   MRN: [id number]  DOS:  04/07/2022 Type of visit - description: cpx  Here for CPX.  Doing well. Has no symptoms or concerns today  Review of Systems   A 14 point review of systems is negative    Past Medical History:  Diagnosis Date   Closed disp fracture of left medial malleolus with routine healing    PPD positive    h/o   RBBB     Past Surgical History:  Procedure Laterality Date   BLEPHAROPLASTY Bilateral 04/01/2022   LIPOMA EXCISION     R leg    ORIF ANKLE FRACTURE Left 11/29/2018   Procedure: Open reduction internal fixation left medial malleolus fracture;  Surgeon: Wylene Simmer, MD;  Location: [facility];  Service: Orthopedics;  Laterality: Left;   TONSILLECTOMY     TRIGGER FINGER RELEASE Right 2019   two finger, local anesthesia, Dr Amedeo Plenty   VASECTOMY     Social History   Socioeconomic History   Marital status: Married    Spouse name: Not on file   Number of children: 3   Years of education: Not on file   Highest education level: Not on file  Occupational History   Occupation: Therapist, sports    Employer: [employer]  Tobacco Use   Smoking status: Former    Types: Cigarettes    Quit date: 03/07/1978    Years since quitting: 44.1   Smokeless tobacco: Never  Vaping Use   Vaping Use: Never used  Substance and Sexual Activity   Alcohol use: Yes    Comment: social   Drug use: No   Sexual activity: Not on file  Other Topics Concern   Not on file  Social History Narrative   Patient is from [place], [place] from [place].   Patient is a Marine scientist,  Works at  [employer]- [employer]   Daughter in [place] son: [place]    Social Determinants of Radio broadcast assistant Strain: Not on [employer] Insecurity: Not on file  Transportation Needs: Not on file  Physical Activity: Not on file  Stress: Not on file  Social  Connections: Not on file  Intimate Partner Violence: Not on file     Current Outpatient Medications  Medication Instructions   ALPRAZolam (XANAX) 0.5 mg, Oral, At bedtime PRN   cetirizine (ZYRTEC) 10 mg, Oral, Daily PRN   diazepam (VALIUM) 5 MG tablet Take 1 tablet 30 minutes prior to your procedure and bring the rest with you.  Have a driver bring you.   erythromycin ophthalmic ointment Use a small amount on stitches 4 times a day for 14 days   fluocinonide (LIDEX) 0.05 % external solution Topical, 2 times daily PRN   Multiple Vitamin (MULTIVITAMIN WITH MINERALS) TABS tablet 1 tablet, Oral, Daily   traMADol (ULTRAM) 50 MG tablet Take 1 tablet every 4-6 hours as needed for pain not relieved by Tylenol.   valACYclovir (VALTREX) 2,000 mg, Oral, 2 times daily PRN       Objective:   Physical Exam BP 122/72   Pulse (!) 49   Temp 97.9 F (36.6 C) (Oral)   Resp 16   Ht '5\' 9"'$  (1.753 m)   Wt 163 lb (73.9 kg)   SpO2 97%   BMI 24.07 kg/m  General: Well developed, NAD, BMI  noted Neck: No  thyromegaly  HEENT:  Normocephalic . Face symmetric, eyelids slightly swollen, had a blepharoplasty recently Lungs:  CTA B Normal respiratory effort, no intercostal retractions, no accessory muscle use. Heart: RRR,  no murmur.  Abdomen:  Not distended, soft, non-tender. No rebound or rigidity.   Lower extremities: no pretibial edema bilaterally  Skin: Exposed areas without rash. Not pale. Not jaundice Neurologic:  alert & oriented X3.  Speech normal, gait appropriate for age and unassisted Strength symmetric and appropriate for age.  Psych: Cognition and judgment appear intact.  Cooperative with normal attention span and concentration.  Behavior appropriate. No anxious or depressed appearing.     Assessment     Assessment A1C 6.0 (2009) Insomnia- anxiety  History + PPD, s/p treatment +FH melanoma: sees derm q year Bradycardia: Chronic, asymptomatic Recurrent cold sores (started  Valtrex as needed 02/2022).  PLAN: Here for CPX IDA: See last note, he had iron deficiency anemia, he felt it was due to blood donation.  At this point he is not donating blood, no GI symptoms, will check a CBC, iron ferritin.  Further advised with results.  Due for colonoscopy in May, patient aware. Tinnitus (R): See last visit, symptoms quickly resolved with prednisone, no HOH, now have very minimal tinnitus on and off.  We talk about possibly an audiology referral but he declined. RTC 1 year

## 2022-04-11 NOTE — Addendum Note (Signed)
Addended byDamita Dunnings D on: 04/11/2022 08:20 AM   Modules accepted: Orders

## 2022-04-19 ENCOUNTER — Other Ambulatory Visit (HOSPITAL_COMMUNITY): Payer: Self-pay

## 2022-05-05 DIAGNOSIS — H52223 Regular astigmatism, bilateral: Secondary | ICD-10-CM | POA: Diagnosis not present

## 2022-05-05 DIAGNOSIS — H5203 Hypermetropia, bilateral: Secondary | ICD-10-CM | POA: Diagnosis not present

## 2022-06-20 ENCOUNTER — Encounter: Payer: Self-pay | Admitting: Internal Medicine

## 2022-07-06 ENCOUNTER — Encounter: Payer: Self-pay | Admitting: Internal Medicine

## 2022-07-07 DIAGNOSIS — S43401A Unspecified sprain of right shoulder joint, initial encounter: Secondary | ICD-10-CM | POA: Diagnosis not present

## 2022-07-07 DIAGNOSIS — S43409A Unspecified sprain of unspecified shoulder joint, initial encounter: Secondary | ICD-10-CM | POA: Insufficient documentation

## 2022-07-07 DIAGNOSIS — M25511 Pain in right shoulder: Secondary | ICD-10-CM | POA: Insufficient documentation

## 2022-07-12 ENCOUNTER — Encounter: Payer: Self-pay | Admitting: Internal Medicine

## 2022-07-27 ENCOUNTER — Telehealth: Payer: Self-pay | Admitting: Family Medicine

## 2022-07-27 ENCOUNTER — Other Ambulatory Visit (HOSPITAL_COMMUNITY): Payer: Self-pay

## 2022-07-27 MED ORDER — ALPRAZOLAM 0.5 MG PO TABS
0.5000 mg | ORAL_TABLET | Freq: Every evening | ORAL | 3 refills | Status: DC | PRN
  Filled 2022-07-27: qty 30, 30d supply, fill #0
  Filled 2023-01-23: qty 30, 30d supply, fill #1

## 2022-07-27 NOTE — Telephone Encounter (Signed)
PDMP okay, Rx sent 

## 2022-07-27 NOTE — Telephone Encounter (Signed)
Requesting: alprazolam 0.5mg   Contract: 02/15/22 UDS: 04/07/22 Last Visit: 04/07/22 Next Visit: None Last Refill: 08/20/21 #30 and 1RF   Please Advise

## 2022-08-05 ENCOUNTER — Ambulatory Visit (AMBULATORY_SURGERY_CENTER): Payer: 59

## 2022-08-05 ENCOUNTER — Other Ambulatory Visit (HOSPITAL_COMMUNITY): Payer: Self-pay

## 2022-08-05 VITALS — Ht 69.0 in | Wt 160.0 lb

## 2022-08-05 DIAGNOSIS — Z1211 Encounter for screening for malignant neoplasm of colon: Secondary | ICD-10-CM

## 2022-08-05 MED ORDER — NA SULFATE-K SULFATE-MG SULF 17.5-3.13-1.6 GM/177ML PO SOLN
1.0000 | Freq: Once | ORAL | 0 refills | Status: AC
  Filled 2022-08-05: qty 354, 2d supply, fill #0
  Filled 2022-08-20: qty 354, 1d supply, fill #0

## 2022-08-05 NOTE — Progress Notes (Signed)

## 2022-08-10 ENCOUNTER — Other Ambulatory Visit (HOSPITAL_COMMUNITY): Payer: Self-pay

## 2022-08-18 ENCOUNTER — Encounter: Payer: Self-pay | Admitting: Internal Medicine

## 2022-08-20 ENCOUNTER — Other Ambulatory Visit (HOSPITAL_COMMUNITY): Payer: Self-pay

## 2022-08-25 ENCOUNTER — Ambulatory Visit (INDEPENDENT_AMBULATORY_CARE_PROVIDER_SITE_OTHER): Payer: 59 | Admitting: Sports Medicine

## 2022-08-25 ENCOUNTER — Other Ambulatory Visit (HOSPITAL_COMMUNITY): Payer: Self-pay

## 2022-08-25 ENCOUNTER — Other Ambulatory Visit: Payer: Self-pay

## 2022-08-25 VITALS — BP 120/72 | Ht 69.0 in | Wt 160.0 lb

## 2022-08-25 DIAGNOSIS — M12811 Other specific arthropathies, not elsewhere classified, right shoulder: Secondary | ICD-10-CM

## 2022-08-25 DIAGNOSIS — M25511 Pain in right shoulder: Secondary | ICD-10-CM | POA: Diagnosis not present

## 2022-08-25 DIAGNOSIS — G8929 Other chronic pain: Secondary | ICD-10-CM

## 2022-08-25 MED ORDER — NITROGLYCERIN 0.2 MG/HR TD PT24
MEDICATED_PATCH | TRANSDERMAL | 1 refills | Status: DC
  Filled 2022-08-25: qty 30, 30d supply, fill #0

## 2022-08-25 NOTE — Patient Instructions (Signed)

## 2022-08-25 NOTE — Progress Notes (Addendum)
PCP: Wanda Plump, MD  Subjective:   HPI: Patient is a 67 y.o. male here for right shoulder pain.  Patient reports falling on outstretched arm about 1 month ago while playing hockey. Now having pain with raising arm above head. Has not been playing hockey, Has been resting shoulder without much improvement. He saw ortho for this was recommended MRI and possible surgery, however he would like to avoid.  Did not get MRI.  Past Medical History:  Diagnosis Date   Allergy    Closed disp fracture of left medial malleolus with routine healing    PPD positive    h/o   RBBB     Current Outpatient Medications on File Prior to Visit  Medication Sig Dispense Refill   ALPRAZolam (XANAX) 0.5 MG tablet Take 1 tablet (0.5 mg total) by mouth at bedtime as needed for anxiety. 30 tablet 3   cetirizine (ZYRTEC) 10 MG tablet Take 10 mg by mouth daily as needed for allergies.     fluocinonide (LIDEX) 0.05 % external solution Apply topically 2 (two) times daily as needed.     Multiple Vitamin (MULTIVITAMIN WITH MINERALS) TABS tablet Take 1 tablet by mouth daily.     traMADol (ULTRAM) 50 MG tablet Take 1 tablet every 4-6 hours as needed for pain not relieved by Tylenol. (Patient not taking: Reported on 04/07/2022) 6 tablet 0   tretinoin (RETIN-A) 0.025 % cream 1 application in the evening to face Externally for cosmetic use Once a day     valACYclovir (VALTREX) 1000 MG tablet Take 2 tablets (2,000 mg total) by mouth 2 (two) times daily as needed (take for 1 day for cold sores). 30 tablet 0   No current facility-administered medications on file prior to visit.    Past Surgical History:  Procedure Laterality Date   BLEPHAROPLASTY Bilateral 04/01/2022   COLON SURGERY     LIPOMA EXCISION     R leg    ORIF ANKLE FRACTURE Left 11/29/2018   Procedure: Open reduction internal fixation left medial malleolus fracture;  Surgeon: Toni Arthurs, MD;  Location: [facility];  Service: Orthopedics;   Laterality: Left;   TONSILLECTOMY     TRIGGER FINGER RELEASE Right 2019   two finger, local anesthesia, Dr Amanda Pea   VASECTOMY      No Known Allergies  BP 120/72   Ht 5\' 9"  (1.753 m)   Wt 160 lb (72.6 kg)   BMI 23.63 kg/m       No data to display              No data to display              Objective:  Physical Exam:  Gen: NAD, comfortable in exam room  Right shoulder No demurity, ecchymosis, or swelling TTP of the superior lateral shoulder without overlying skin changes, no tenderness over the bicipital groove or AC joint Pain arc present at 90 degrees Full active and passive ROM on flexion, abduction, and internal rotation, mildly limited on external rotation. Weakness on empty can test and external rotation Hawkins and neers negative Normal sensation and distal pulses   [place] right shoulder: The distal suprapspinatus shows a partial thickness tear at the foot plate Infraspinatus shows a similar area of hypoechoic change suggestive of partial tear There is irregularity of GH head AC joint with mild narrowing and calcification Biceps intact LAX ans SAX Subscap intact  Impression: Partial tears of infraspinatus and supraspinatus tendons  Ultrasound  and interpretation by Sibyl Parr. Fields, MD   Assessment & Plan:  1. Right shoulder pain -Should [place] consistent with partial supraspinatus and infraspinatus tear after fall. Provided home exercise program. Nitroglycerin patches  I observed and examined the patient with the resident and agree with assessment and plan.  Note reviewed and modified by me. Sterling Big, MD

## 2022-08-25 NOTE — Assessment & Plan Note (Signed)
Standard rehab for [facility] NTG protocol Avoid hockey and too much stress on RT shoulder until F/U  Reck 6 weeks

## 2022-08-31 ENCOUNTER — Other Ambulatory Visit: Payer: Self-pay | Admitting: *Deleted

## 2022-08-31 NOTE — Progress Notes (Signed)
Note written to place burn bootcamp membership on hold.

## 2022-09-02 ENCOUNTER — Encounter: Payer: Self-pay | Admitting: Internal Medicine

## 2022-09-02 ENCOUNTER — Ambulatory Visit (AMBULATORY_SURGERY_CENTER): Payer: 59 | Admitting: Internal Medicine

## 2022-09-02 VITALS — BP 106/63 | HR 47 | Temp 98.0°F | Resp 10 | Ht 69.0 in | Wt 160.0 lb

## 2022-09-02 DIAGNOSIS — D120 Benign neoplasm of cecum: Secondary | ICD-10-CM | POA: Diagnosis not present

## 2022-09-02 DIAGNOSIS — K635 Polyp of colon: Secondary | ICD-10-CM | POA: Diagnosis not present

## 2022-09-02 DIAGNOSIS — Z1211 Encounter for screening for malignant neoplasm of colon: Secondary | ICD-10-CM

## 2022-09-02 MED ORDER — SODIUM CHLORIDE 0.9 % IV SOLN: 500.0000 mL | Freq: Once | INTRAVENOUS | Status: DC

## 2022-09-02 NOTE — Progress Notes (Signed)
HISTORY OF PRESENT ILLNESS:  Jorge Humphrey is a 67 y.o. male who presents today for screening colonoscopy.  No complaints.  Index exam 2014 was normal  REVIEW OF SYSTEMS:  All non-GI ROS negative except for  Past Medical History:  Diagnosis Date   Allergy    Closed disp fracture of left medial malleolus with routine healing    PPD positive    h/o   RBBB     Past Surgical History:  Procedure Laterality Date   BLEPHAROPLASTY Bilateral 04/01/2022   COLON SURGERY     LIPOMA EXCISION     R leg    ORIF ANKLE FRACTURE Left 11/29/2018   Procedure: Open reduction internal fixation left medial malleolus fracture;  Surgeon: Toni Arthurs, MD;  Location: [facility];  Service: Orthopedics;  Laterality: Left;   TONSILLECTOMY     TRIGGER FINGER RELEASE Right 2019   two finger, local anesthesia, Dr Amanda Pea   VASECTOMY      Social History Seichi E Bran  reports that he quit smoking about 44 years ago. His smoking use included cigarettes. He has never used smokeless tobacco. He reports current alcohol use. He reports that he does not use drugs.  family history includes Cancer in his brother; Depression in his daughter; Heart block in an other family member; Melanoma in his father; Migraines in his daughter; Non-Hodgkin's lymphoma in his father; Prostate cancer in an other family member.  No Known Allergies     PHYSICAL EXAMINATION: Vital signs: BP 111/62   Pulse (!) 54   Temp 98 F (36.7 C) (Temporal)   Resp 12   Ht 5\' 9"  (1.753 m)   Wt 160 lb (72.6 kg)   SpO2 99%   BMI 23.63 kg/m  General: Well-developed, well-nourished, no acute distress HEENT: Sclerae are anicteric, conjunctiva pink. Oral mucosa intact Lungs: Clear Heart: Regular Abdomen: soft, nontender, nondistended, no obvious ascites, no peritoneal signs, normal bowel sounds. No organomegaly. Extremities: No edema Psychiatric: alert and oriented x3. Cooperative     ASSESSMENT:  Colon cancer  screening   PLAN:  Screening colonoscopy

## 2022-09-02 NOTE — Progress Notes (Signed)
Pt's states no medical or surgical changes since previsit or office visit. 

## 2022-09-02 NOTE — Progress Notes (Signed)
Uneventful anesthetic. Report to pacu rn. Vss. Care resumed by rn. 

## 2022-09-02 NOTE — Progress Notes (Signed)
Called to room to assist during endoscopic procedure.  Patient ID and intended procedure confirmed with present staff. Received instructions for my participation in the procedure from the performing physician.  

## 2022-09-02 NOTE — Op Note (Signed)
[facility] Patient Name: Jorge Humphrey Procedure Date: 09/02/2022 7:58 AM MRN: [id number] Endoscopist: Wilhemina Bonito. Marina Goodell , MD, [id number] Age: 67 Referring MD:  Date of Birth: 12/04/1955 Gender: Male Account #: [id number] Procedure:                Colonoscopy with cold snare polypectomy x 1 Indications:              Screening for colorectal malignant neoplasm. Index                            examination 2014 was normal Medicines:                Monitored Anesthesia Care Procedure:                Pre-Anesthesia Assessment:                           - Prior to the procedure, a History and Physical                            was performed, and patient medications and                            allergies were reviewed. The patient's tolerance of                            previous anesthesia was also reviewed. The risks                            and benefits of the procedure and the sedation                            options and risks were discussed with the patient.                            All questions were answered, and informed consent                            was obtained. Prior Anticoagulants: The patient has                            taken no anticoagulant or antiplatelet agents. ASA                            Grade Assessment: I - A normal, healthy patient.                            After reviewing the risks and benefits, the patient                            was deemed in satisfactory condition to undergo the                            procedure.  After obtaining informed consent, the colonoscope                            was passed under direct vision. Throughout the                            procedure, the patient's blood pressure, pulse, and                            oxygen saturations were monitored continuously. The                            CF [id number] #[id number] was introduced through the anus                            and  advanced to the the cecum, identified by                            appendiceal orifice and ileocecal valve. The                            ileocecal valve, appendiceal orifice, and rectum                            were photographed. The quality of the bowel                            preparation was excellent. The colonoscopy was                            performed without difficulty. The patient tolerated                            the procedure well. The bowel preparation used was                            SUPREP via split dose instruction. Scope In: 8:34:45 AM Scope Out: 8:46:06 AM Scope Withdrawal Time: 0 hours 8 minutes 18 seconds  Total Procedure Duration: 0 hours 11 minutes 21 seconds  Findings:                 A 3 mm polyp was found in the cecum. The polyp was                            sessile. The polyp was removed with a cold snare.                            Resection and retrieval were complete.                           Internal hemorrhoids were found during                            retroflexion. The hemorrhoids were small.  The exam was otherwise without abnormality on                            direct and retroflexion views. Complications:            No immediate complications. Estimated blood loss:                            None. Estimated Blood Loss:     Estimated blood loss: none. Impression:               - One 3 mm polyp in the cecum, removed with a cold                            snare. Resected and retrieved.                           - Internal hemorrhoids.                           - The examination was otherwise normal on direct                            and retroflexion views. Recommendation:           - Repeat colonoscopy in 7-10 years for surveillance.                           - Patient has a contact number available for                            emergencies. The signs and symptoms of potential                             delayed complications were discussed with the                            patient. Return to normal activities tomorrow.                            Written discharge instructions were provided to the                            patient.                           - Resume previous diet.                           - Continue present medications.                           - Await pathology results. Wilhemina Bonito. Marina Goodell, MD 09/02/2022 8:52:41 AM This report has been signed electronically.

## 2022-09-02 NOTE — Patient Instructions (Signed)
Handouts on polyps and hemorrhoids given to you today  Await pathology results   YOU HAD AN ENDOSCOPIC PROCEDURE TODAY AT THE [facility]:   Refer to the procedure report that was given to you for any specific questions about what was found during the examination.  If the procedure report does not answer your questions, please call your gastroenterologist to clarify.  If you requested that your care partner not be given the details of your procedure findings, then the procedure report has been included in a sealed envelope for you to review at your convenience later.  YOU SHOULD EXPECT: Some feelings of bloating in the abdomen. Passage of more gas than usual.  Walking can help get rid of the air that was put into your GI tract during the procedure and reduce the bloating. If you had a lower endoscopy (such as a colonoscopy or flexible sigmoidoscopy) you may notice spotting of blood in your stool or on the toilet paper. If you underwent a bowel prep for your procedure, you may not have a normal bowel movement for a few days.  Please Note:  You might notice some irritation and congestion in your nose or some drainage.  This is from the oxygen used during your procedure.  There is no need for concern and it should clear up in a day or so.  SYMPTOMS TO REPORT IMMEDIATELY:  Following lower endoscopy (colonoscopy or flexible sigmoidoscopy):  Excessive amounts of blood in the stool  Significant tenderness or worsening of abdominal pains  Swelling of the abdomen that is new, acute  Fever of 100F or higher  For urgent or emergent issues, a gastroenterologist can be reached at any hour by calling [phone]. Do not use MyChart messaging for urgent concerns.    DIET:  We do recommend a small meal at first, but then you may proceed to your regular diet.  Drink plenty of fluids but you should avoid alcoholic beverages for 24 hours.  ACTIVITY:  You should plan to take it easy for the rest  of today and you should NOT DRIVE or use heavy machinery until tomorrow (because of the sedation medicines used during the test).    FOLLOW UP: Our staff will call the number listed on your records the next business day following your procedure.  We will call around 7:15- 8:00 am to check on you and address any questions or concerns that you may have regarding the information given to you following your procedure. If we do not reach you, we will leave a message.     If any biopsies were taken you will be contacted by phone or by letter within the next 1-3 weeks.  Please call us at [phone] if you have not heard about the biopsies in 3 weeks.    SIGNATURES/CONFIDENTIALITY: You and/or your care partner have signed paperwork which will be entered into your electronic medical record.  These signatures attest to the fact that that the information above on your After Visit Summary has been reviewed and is understood.  Full responsibility of the confidentiality of this discharge information lies with you and/or your care-partner. 

## 2022-09-05 ENCOUNTER — Telehealth: Payer: Self-pay | Admitting: *Deleted

## 2022-09-05 NOTE — Telephone Encounter (Signed)
  Follow up Call-     09/02/2022    7:15 AM  Call back number  Post procedure Call Back phone  # [phone]  Permission to leave phone message Yes     Patient questions:  Do you have a fever, pain , or abdominal swelling? No. Pain Score  0 *  Have you tolerated food without any problems? Yes.    Have you been able to return to your normal activities? Yes.    Do you have any questions about your discharge instructions: Diet   No. Medications  No. Follow up visit  No.  Do you have questions or concerns about your Care? No.  Actions: * If pain score is 4 or above: No action needed, pain <4.

## 2022-09-06 ENCOUNTER — Other Ambulatory Visit (HOSPITAL_COMMUNITY): Payer: Self-pay

## 2022-09-07 ENCOUNTER — Encounter: Payer: Self-pay | Admitting: Internal Medicine

## 2022-09-29 ENCOUNTER — Encounter: Payer: Self-pay | Admitting: Internal Medicine

## 2022-09-29 DIAGNOSIS — E785 Hyperlipidemia, unspecified: Secondary | ICD-10-CM

## 2022-09-29 DIAGNOSIS — D509 Iron deficiency anemia, unspecified: Secondary | ICD-10-CM

## 2022-09-29 NOTE — Telephone Encounter (Signed)
Orders placed.

## 2022-09-29 NOTE — Telephone Encounter (Signed)
Add iron, ferritin, DX anemia

## 2022-09-29 NOTE — Addendum Note (Signed)
Addended byConrad [place] D on: 09/29/2022 10:29 AM   Modules accepted: Orders

## 2022-10-06 ENCOUNTER — Ambulatory Visit (INDEPENDENT_AMBULATORY_CARE_PROVIDER_SITE_OTHER): Payer: 59 | Admitting: Sports Medicine

## 2022-10-06 VITALS — BP 104/68 | Ht 69.0 in | Wt 160.0 lb

## 2022-10-06 DIAGNOSIS — M12811 Other specific arthropathies, not elsewhere classified, right shoulder: Secondary | ICD-10-CM | POA: Diagnosis not present

## 2022-10-06 NOTE — Progress Notes (Signed)
CHIEF COMPLAINT:   Right Shoulder Pain     SUBJECTIVE:  HPI:  Pleasant 67yo RHD male her for follow-up on right rotator cuff tendinopathy. He reports that following his HEP and NG protocol he is about 40-50% improved over the past 6 weeks. He is tolerating the NG patches well, though does get H/As if using overnight resolved when applying in the AM. He feels his external rotation has greatly improved. Still pain and crepitus with forward flexion and overhead use. He reports greatly reduced use of NSAIDs since starting his rehab.    ROS:     See HPI   PERTINENT  PMH / PSH / FH / SH:  Past Medical, Surgical, Social, and Family History Reviewed & Updated in the EMR.  Pertinent findings include:  RBBB ORIF Left Ankle Trigger Finger Release  No Known Allergies  BP 104/68   Ht 5\' 9"  (1.753 m)   Wt 160 lb (72.6 kg)   BMI 23.63 kg/m   Objective:  Physical Exam:  Gen: NAD, comfortable in exam room RESP: Unlabored breathing PSY: Normal mood, congruent affect  Right Shoulder MSK: No swelling, ecchymoses. No gross deformity. No TTP. FROM, crepitus noted with end-point of flexion/abduction. + painful arc. Negative Hawkins, Neers. Negative Yergasons. Strength 5/5 with empty can and resisted internal/external rotation. Negative drop arm. NV intact distally.   Assessment & Plan:  1. Rotator cuff arthropathy of right shoulder Greatly improved pain and ROM since starting HEP + NG protocol. Noted to have partial tears of supraspinatus and infraspinatous tears on initial U/S 6 weeks ago. Encouraged to continue HEP though start incorporating weights 3-5# and can start to progress to overhead rehab (weightless). F/u in 6 weeks and will plan to repeat U/S at that time to re-evaluate SS and IS tendons.  I observed and examined the patient with the [facility] resident and agree with assessment and plan.  Note reviewed and modified by me. Sterling Big, MD

## 2022-10-11 ENCOUNTER — Other Ambulatory Visit (INDEPENDENT_AMBULATORY_CARE_PROVIDER_SITE_OTHER): Payer: 59

## 2022-10-11 DIAGNOSIS — E785 Hyperlipidemia, unspecified: Secondary | ICD-10-CM | POA: Diagnosis not present

## 2022-10-11 DIAGNOSIS — D509 Iron deficiency anemia, unspecified: Secondary | ICD-10-CM

## 2022-10-11 LAB — LIPID PANEL
Cholesterol: 185 mg/dL (ref 0–200)
HDL: 50.5 mg/dL (ref >39.00)
LDL Cholesterol: 122 mg/dL — ABNORMAL HIGH (ref 0–99)
NonHDL: 134.59
Total CHOL/HDL Ratio: 4
Triglycerides: 62 mg/dL (ref 0.0–149.0)
VLDL: 12.4 mg/dL (ref 0.0–40.0)

## 2022-10-11 LAB — IRON: Iron: 77 ug/dL (ref 42–165)

## 2022-10-11 LAB — FERRITIN: Ferritin: 52.3 ng/mL (ref 22.0–322.0)

## 2022-10-13 ENCOUNTER — Encounter: Payer: Self-pay | Admitting: Internal Medicine

## 2022-10-14 ENCOUNTER — Telehealth: Payer: Self-pay

## 2022-10-14 DIAGNOSIS — E785 Hyperlipidemia, unspecified: Secondary | ICD-10-CM

## 2022-10-14 DIAGNOSIS — R918 Other nonspecific abnormal finding of lung field: Secondary | ICD-10-CM

## 2022-10-14 NOTE — Telephone Encounter (Signed)
Please enter a order for a coronary calcium score, Dx increased cardiovascular risk. I am sending the patient a message

## 2022-10-14 NOTE — Telephone Encounter (Signed)
Message sent to provider as a telephone message spoke with patient

## 2022-10-14 NOTE — Telephone Encounter (Signed)
Patient sent a message with concerns about a CT scan for Calcium, he states it was discussed at one of his recent visits and would like to know providers opinion due to lipid panel levels. I informed patient of his lab results and possible options with statins and patient states he does not want to do statins at this time. His only concern today is the calcium scan, he wants to know if he should have it done before next appointment or wait to discuss at that visit.

## 2022-10-17 NOTE — Addendum Note (Signed)
Addended byConrad [place] D on: 10/17/2022 07:43 AM   Modules accepted: Orders

## 2022-10-17 NOTE — Telephone Encounter (Signed)
Order placed

## 2022-10-21 ENCOUNTER — Ambulatory Visit (HOSPITAL_BASED_OUTPATIENT_CLINIC_OR_DEPARTMENT_OTHER)
Admission: RE | Admit: 2022-10-21 | Discharge: 2022-10-21 | Disposition: A | Discharge status: Home / Self Care | Payer: 59 | Source: Ambulatory Visit | Attending: Internal Medicine | Admitting: Internal Medicine

## 2022-10-21 DIAGNOSIS — E785 Hyperlipidemia, unspecified: Secondary | ICD-10-CM | POA: Insufficient documentation

## 2022-10-21 DIAGNOSIS — R918 Other nonspecific abnormal finding of lung field: Secondary | ICD-10-CM | POA: Diagnosis not present

## 2022-10-24 ENCOUNTER — Encounter: Payer: Self-pay | Admitting: Internal Medicine

## 2022-10-24 ENCOUNTER — Other Ambulatory Visit (HOSPITAL_COMMUNITY): Payer: Self-pay

## 2022-10-24 MED ORDER — ATORVASTATIN CALCIUM 20 MG PO TABS
20.0000 mg | ORAL_TABLET | Freq: Every day | ORAL | 0 refills | Status: DC
  Filled 2022-10-24: qty 90, 90d supply, fill #0

## 2022-10-24 NOTE — Addendum Note (Signed)
Addended byConrad [place] D on: 10/24/2022 07:55 AM   Modules accepted: Orders

## 2022-10-25 ENCOUNTER — Other Ambulatory Visit (HOSPITAL_COMMUNITY): Payer: Self-pay

## 2022-10-27 NOTE — Addendum Note (Signed)
Addended byConrad [place] D on: 10/27/2022 07:51 AM   Modules accepted: Orders

## 2022-11-22 ENCOUNTER — Ambulatory Visit (INDEPENDENT_AMBULATORY_CARE_PROVIDER_SITE_OTHER): Payer: 59 | Admitting: Sports Medicine

## 2022-11-22 ENCOUNTER — Other Ambulatory Visit: Payer: Self-pay

## 2022-11-22 ENCOUNTER — Encounter: Payer: Self-pay | Admitting: Sports Medicine

## 2022-11-22 VITALS — BP 118/76 | Ht 69.0 in | Wt 160.0 lb

## 2022-11-22 DIAGNOSIS — M12811 Other specific arthropathies, not elsewhere classified, right shoulder: Secondary | ICD-10-CM

## 2022-11-22 NOTE — Progress Notes (Signed)
Jorge Humphrey - 67 y.o. male MRN [id number]  Date of birth: March 08, 1955  PCP: Wanda Plump, MD  Subjective:  No chief complaint on file.  R shoulder tendonopathy  HPI: Past Medical, Surgical, Social, and Family History Reviewed & Updated per EMR.   Patient is a 67 y.o. male here for follow up on R shoulder pain from a partial rotator cuff tear, last seen on 10/06/2022. The patient has been following the NG patch protocol with range of motion exercises which has greatly improved his pain and range of motion.  He has not been doing any weight lifting but did go swimming recently on a trip to [place] which felt great for him.  He has been having some crepitus still with flexion but is able to move through the entire range of motion without pain.   Past Medical History:  Diagnosis Date   Allergy    Closed disp fracture of left medial malleolus with routine healing    PPD positive    h/o   RBBB     Current Outpatient Medications on File Prior to Visit  Medication Sig Dispense Refill   ALPRAZolam (XANAX) 0.5 MG tablet Take 1 tablet (0.5 mg total) by mouth at bedtime as needed for anxiety. 30 tablet 3   atorvastatin (LIPITOR) 20 MG tablet Take 1 tablet (20 mg total) by mouth at bedtime. 90 tablet 0   cetirizine (ZYRTEC) 10 MG tablet Take 10 mg by mouth daily as needed for allergies.     fluocinonide (LIDEX) 0.05 % external solution Apply topically 2 (two) times daily as needed.     Multiple Vitamin (MULTIVITAMIN WITH MINERALS) TABS tablet Take 1 tablet by mouth daily.     nitroGLYCERIN (NITRODUR - DOSED IN MG/24 HR) 0.2 mg/hr patch Use 1/4 patch daily to the affected area. 30 patch 1   tretinoin (RETIN-A) 0.025 % cream 1 application in the evening to face Externally for cosmetic use Once a day     valACYclovir (VALTREX) 1000 MG tablet Take 2 tablets (2,000 mg total) by mouth 2 (two) times daily as needed (take for 1 day for cold sores). 30 tablet 0   No current facility-administered  medications on file prior to visit.    Past Surgical History:  Procedure Laterality Date   BLEPHAROPLASTY Bilateral 04/01/2022   COLON SURGERY     LIPOMA EXCISION     R leg    ORIF ANKLE FRACTURE Left 11/29/2018   Procedure: Open reduction internal fixation left medial malleolus fracture;  Surgeon: Toni Arthurs, MD;  Location: [facility];  Service: Orthopedics;  Laterality: Left;   TONSILLECTOMY     TRIGGER FINGER RELEASE Right 2019   two finger, local anesthesia, Dr Amanda Pea   VASECTOMY      No Known Allergies      Objective:  Physical Exam: VS: BP:118/76  HR: bpm  TEMP: ( )  RESP:   HT:5\' 9"  (175.3 cm)   WT:160 lb (72.6 kg)  BMI:23.62  Gen: NAD, speaks clearly, comfortable in exam room Respiratory: Normal respiratory effort on room air. No signs of distress Skin: No rashes, abrasions, or ecchymosis MSK:  Shoulder: Inspection no deformities, edema, or ecchymosis ROM: Full flexion, extension, abduction, internal and external rotation.  Crepitus palpated from 90 to 120 degrees of flexion but no pain elicited. Strength: 4/5 in all directions, biceps strength 5/5 AC joint: Chronic bony changes palpated but no tenderness Special tests: Hawkins, Neer's, crossarm, empty can, O'Briens, speeds, [place]  all negative  Limited ultrasound of right shoulder:  Biceps Tendon SAX and LAX: visualized in bicipital groove w/ some hypoechoic changes at the intersection of the supraspinatus, pectoralis minor and subscapularis insertions in tact, IR/ER does not demonstrate popping of the tendon out of the groove Subscapularis tendon - viewed in SAX and LAX inserting into the inferior lesser tubercle of humerus. echogenics: no hypo/hyperechoic changes present AC joint - w/ some superior osteophyte formation, no giser sign Supraspinatus tendon - viewed in SAX and LAX, tendon in tact, insertion at superior facet of greater tubercle of the humerus w/ echogenics: hypoechoic changes  deep to the tendon at the intersection of the proximal biceps tendon, dynamic view w/ out impingement at the acromion  Infraspinatus and Teres minor tendons - viewed in LAX and SAX at the middle facet of the greater tubercle of humerus w/ echogenics: no hypo/hyperechoic changes present Posterior glenohumeral joint visualized without effusion  Summary: Supraspinatus tear is now healed.  Findings suggestive of continued biceps tendinitis at the intersection of the supraspinatus.  Ultrasound and interpretation by Dr. Webb Silversmith and Dr. Darrick Penna    Assessment & Plan:   Rotator cuff arthropathy of right shoulder - Jansen has gotten marked improvement from the nitroglycerin patch protocol.  His supraspinatus tear has healed on ultrasound exam.  There is still some fluid surrounding the biceps tendon indicative of tendinitis - We discussed starting with gentle biceps curls, pronation and supination as well as resisted internal and external rotation which we will progress slowly. - He can continue with nitroglycerin patches weaning them down.  He can start with every other day use and go down from there. - If his symptoms resolve within the next 3 months he will not need to follow-up with [place].  If he continues to have symptoms we can rescan his shoulder for further evaluation.     Rica Mote MD [facility] Health Sports Medicine Fellow  I observed and examined the patient with the [facility] resident and agree with assessment and plan.  Note reviewed and modified by me. Sterling Big, MD

## 2022-11-22 NOTE — Assessment & Plan Note (Signed)
-   Jorge Humphrey has gotten marked improvement from the nitroglycerin patch protocol.  His supraspinatus tear has healed on ultrasound exam.  There is still some fluid surrounding the biceps tendon indicative of tendinitis - We discussed starting with gentle biceps curls, pronation and supination as well as resisted internal and external rotation which we will progress slowly. - He can continue with nitroglycerin patches weaning them down.  He can start with every other day use and go down from there. - If his symptoms resolve within the next 3 months he will not need to follow-up with [place].  If he continues to have symptoms we can rescan his shoulder for further evaluation.

## 2023-01-23 ENCOUNTER — Other Ambulatory Visit: Payer: Self-pay

## 2023-01-24 ENCOUNTER — Other Ambulatory Visit (HOSPITAL_COMMUNITY): Payer: Self-pay

## 2023-01-30 ENCOUNTER — Other Ambulatory Visit (HOSPITAL_COMMUNITY): Payer: Self-pay

## 2023-02-06 ENCOUNTER — Encounter: Payer: Self-pay | Admitting: Internal Medicine

## 2023-02-07 DIAGNOSIS — L821 Other seborrheic keratosis: Secondary | ICD-10-CM | POA: Diagnosis not present

## 2023-02-07 DIAGNOSIS — D239 Other benign neoplasm of skin, unspecified: Secondary | ICD-10-CM | POA: Diagnosis not present

## 2023-02-07 DIAGNOSIS — L814 Other melanin hyperpigmentation: Secondary | ICD-10-CM | POA: Diagnosis not present

## 2023-02-07 DIAGNOSIS — L570 Actinic keratosis: Secondary | ICD-10-CM | POA: Diagnosis not present

## 2023-02-07 DIAGNOSIS — D225 Melanocytic nevi of trunk: Secondary | ICD-10-CM | POA: Diagnosis not present

## 2023-02-07 DIAGNOSIS — D2271 Melanocytic nevi of right lower limb, including hip: Secondary | ICD-10-CM | POA: Diagnosis not present

## 2023-02-07 DIAGNOSIS — L578 Other skin changes due to chronic exposure to nonionizing radiation: Secondary | ICD-10-CM | POA: Diagnosis not present

## 2023-02-09 ENCOUNTER — Other Ambulatory Visit (INDEPENDENT_AMBULATORY_CARE_PROVIDER_SITE_OTHER): Payer: 59

## 2023-02-09 ENCOUNTER — Ambulatory Visit (HOSPITAL_BASED_OUTPATIENT_CLINIC_OR_DEPARTMENT_OTHER)
Admission: RE | Admit: 2023-02-09 | Discharge: 2023-02-09 | Disposition: A | Discharge status: Home / Self Care | Payer: 59 | Source: Ambulatory Visit | Attending: Internal Medicine | Admitting: Internal Medicine

## 2023-02-09 DIAGNOSIS — R918 Other nonspecific abnormal finding of lung field: Secondary | ICD-10-CM | POA: Insufficient documentation

## 2023-02-09 DIAGNOSIS — E785 Hyperlipidemia, unspecified: Secondary | ICD-10-CM

## 2023-02-09 LAB — LIPID PANEL
Cholesterol: 112 mg/dL (ref 0–200)
HDL: 39.3 mg/dL (ref >39.00)
LDL Cholesterol: 58 mg/dL (ref 0–99)
NonHDL: 72.42
Total CHOL/HDL Ratio: 3
Triglycerides: 72 mg/dL (ref 0.0–149.0)
VLDL: 14.4 mg/dL (ref 0.0–40.0)

## 2023-02-09 LAB — ALT: ALT: 17 U/L (ref 0–53)

## 2023-02-09 LAB — AST: AST: 16 U/L (ref 0–37)

## 2023-02-10 ENCOUNTER — Other Ambulatory Visit (HOSPITAL_COMMUNITY): Payer: Self-pay

## 2023-02-10 MED ORDER — ATORVASTATIN CALCIUM 20 MG PO TABS
20.0000 mg | ORAL_TABLET | Freq: Every day | ORAL | 3 refills | Status: AC
  Filled 2023-02-10: qty 90, 90d supply, fill #0
  Filled 2023-06-13: qty 90, 90d supply, fill #1
  Filled 2023-09-11: qty 90, 90d supply, fill #2
  Filled 2024-01-23: qty 90, 90d supply, fill #3

## 2023-02-10 NOTE — Addendum Note (Signed)
Addended byConrad [place] D on: 02/10/2023 01:05 PM   Modules accepted: Orders

## 2023-02-17 ENCOUNTER — Other Ambulatory Visit (HOSPITAL_COMMUNITY): Payer: Self-pay

## 2023-05-08 DIAGNOSIS — H524 Presbyopia: Secondary | ICD-10-CM | POA: Diagnosis not present

## 2023-05-08 DIAGNOSIS — H52223 Regular astigmatism, bilateral: Secondary | ICD-10-CM | POA: Diagnosis not present

## 2023-05-08 DIAGNOSIS — H5203 Hypermetropia, bilateral: Secondary | ICD-10-CM | POA: Diagnosis not present

## 2023-06-13 ENCOUNTER — Other Ambulatory Visit (HOSPITAL_COMMUNITY): Payer: Self-pay

## 2023-06-13 ENCOUNTER — Telehealth: Payer: Self-pay | Admitting: Internal Medicine

## 2023-06-13 MED ORDER — ALPRAZOLAM 0.5 MG PO TABS
0.5000 mg | ORAL_TABLET | Freq: Every evening | ORAL | 3 refills | Status: DC | PRN
  Filled 2023-06-13: qty 30, 30d supply, fill #0

## 2023-06-13 NOTE — Telephone Encounter (Signed)
 PDMP okay, Rx sent

## 2023-06-13 NOTE — Telephone Encounter (Signed)
 Requesting: alprazolam 0.5mg   Contract: 04/07/22 UDS: Was ordered on 04/07/22 but not done Last Visit: 04/07/22 Next Visit: 08/09/23 Last Refill: 07/27/22 #30 and 2RF   Please Advise

## 2023-07-07 ENCOUNTER — Other Ambulatory Visit (HOSPITAL_COMMUNITY): Payer: Self-pay

## 2023-08-09 ENCOUNTER — Ambulatory Visit (INDEPENDENT_AMBULATORY_CARE_PROVIDER_SITE_OTHER): Admitting: Internal Medicine

## 2023-08-09 ENCOUNTER — Encounter: Payer: Self-pay | Admitting: Internal Medicine

## 2023-08-09 VITALS — BP 116/66 | HR 59 | Temp 97.8°F | Resp 16 | Ht 69.0 in | Wt 159.5 lb

## 2023-08-09 DIAGNOSIS — Z0000 Encounter for general adult medical examination without abnormal findings: Secondary | ICD-10-CM

## 2023-08-09 DIAGNOSIS — R739 Hyperglycemia, unspecified: Secondary | ICD-10-CM | POA: Diagnosis not present

## 2023-08-09 DIAGNOSIS — F419 Anxiety disorder, unspecified: Secondary | ICD-10-CM | POA: Diagnosis not present

## 2023-08-09 DIAGNOSIS — Z79899 Other long term (current) drug therapy: Secondary | ICD-10-CM

## 2023-08-09 DIAGNOSIS — E785 Hyperlipidemia, unspecified: Secondary | ICD-10-CM | POA: Diagnosis not present

## 2023-08-09 LAB — LIPID PANEL
Cholesterol: 112 mg/dL (ref 0–200)
HDL: 47.6 mg/dL (ref >39.00)
LDL Cholesterol: 56 mg/dL (ref 0–99)
NonHDL: 63.92
Total CHOL/HDL Ratio: 2
Triglycerides: 42 mg/dL (ref 0.0–149.0)
VLDL: 8.4 mg/dL (ref 0.0–40.0)

## 2023-08-09 LAB — COMPREHENSIVE METABOLIC PANEL WITH GFR
ALT: 19 U/L (ref 0–53)
AST: 22 U/L (ref 0–37)
Albumin: 4.4 g/dL (ref 3.5–5.2)
Alkaline Phosphatase: 65 U/L (ref 39–117)
BUN: 23 mg/dL (ref 6–23)
CO2: 27 meq/L (ref 19–32)
Calcium: 9.3 mg/dL (ref 8.4–10.5)
Chloride: 107 meq/L (ref 96–112)
Creatinine, Ser: 0.94 mg/dL (ref 0.40–1.50)
GFR: 84.71 mL/min (ref >60.00)
Glucose, Bld: 101 mg/dL — ABNORMAL HIGH (ref 70–99)
Potassium: 5.1 meq/L (ref 3.5–5.1)
Sodium: 141 meq/L (ref 135–145)
Total Bilirubin: 0.7 mg/dL (ref 0.2–1.2)
Total Protein: 6.3 g/dL (ref 6.0–8.3)

## 2023-08-09 LAB — CBC WITH DIFFERENTIAL/PLATELET
Basophils Absolute: 0.1 10*3/uL (ref 0.0–0.1)
Basophils Relative: 1.5 % (ref 0.0–3.0)
Eosinophils Absolute: 0.2 10*3/uL (ref 0.0–0.7)
Eosinophils Relative: 4.1 % (ref 0.0–5.0)
HCT: 42.9 % (ref 39.0–52.0)
Hemoglobin: 14.3 g/dL (ref 13.0–17.0)
Lymphocytes Relative: 30.3 % (ref 12.0–46.0)
Lymphs Abs: 1.1 10*3/uL (ref 0.7–4.0)
MCHC: 33.4 g/dL (ref 30.0–36.0)
MCV: 88.7 fl (ref 78.0–100.0)
Monocytes Absolute: 0.5 10*3/uL (ref 0.1–1.0)
Monocytes Relative: 12.4 % — ABNORMAL HIGH (ref 3.0–12.0)
Neutro Abs: 1.9 10*3/uL (ref 1.4–7.7)
Neutrophils Relative %: 51.7 % (ref 43.0–77.0)
Platelets: 157 10*3/uL (ref 150.0–400.0)
RBC: 4.83 Mil/uL (ref 4.22–5.81)
RDW: 13.9 % (ref 11.5–15.5)
WBC: 3.7 10*3/uL — ABNORMAL LOW (ref 4.0–10.5)

## 2023-08-09 LAB — PSA: PSA: 0.82 ng/mL (ref 0.10–4.00)

## 2023-08-09 LAB — HEMOGLOBIN A1C: Hgb A1c MFr Bld: 6 % (ref 4.6–6.5)

## 2023-08-09 NOTE — Progress Notes (Unsigned)
 Subjective:    Patient ID: Jorge Humphrey, male    DOB: 08/01/1955, 68 y.o.   MRN: [id number]  DOS:  08/09/2023 Type of visit - description: CPX  Here for CPX. Feeling well.  Wt Readings from Last 3 Encounters:  08/09/23 159 lb 8 oz (72.3 kg)  11/22/22 160 lb (72.6 kg)  10/06/22 160 lb (72.6 kg)   Review of Systems  A 14 point review of systems is negative    Past Medical History:  Diagnosis Date   Allergy    Closed disp fracture of left medial malleolus with routine healing    PPD positive    h/o   RBBB     Past Surgical History:  Procedure Laterality Date   BLEPHAROPLASTY Bilateral 04/01/2022   COLON SURGERY     LIPOMA EXCISION     R leg    ORIF ANKLE FRACTURE Left 11/29/2018   Procedure: Open reduction internal fixation left medial malleolus fracture;  Surgeon: Amada Backer, MD;  Location: [facility];  Service: Orthopedics;  Laterality: Left;   TONSILLECTOMY     TRIGGER FINGER RELEASE Right 2019   two finger, local anesthesia, Dr Aloha Arnold   VASECTOMY     Social History   Socioeconomic History   Marital status: Married    Spouse name: Not on file   Number of children: 3   Years of education: Not on file   Highest education level: Master's degree (e.g., MA, MS, MEng, MEd, MSW, MBA)  Occupational History   Occupation: Charity fundraiser- OR Public affairs consultant: [employer]  Tobacco Use   Smoking status: Former    Current packs/day: 0.00    Types: Cigarettes    Quit date: 03/07/1978    Years since quitting: 45.4   Smokeless tobacco: Never  Vaping Use   Vaping status: Never Used  Substance and Sexual Activity   Alcohol use: Yes    Comment: social   Drug use: No   Sexual activity: Not on file  Other Topics Concern   Not on file  Social History Narrative   Patient is from [place], [place] from [place].   Patient is a Engineer, civil (consulting),  Works at  [employer]    Oldest son- [place]   Daughter in [place]   Youngest son: [place]     Social Drivers of [employer]: Low Risk  (08/02/2023)   Overall Financial Resource Strain (CARDIA)    Difficulty of Paying Living Expenses: Not hard at all  Food Insecurity: Unknown (08/02/2023)   Hunger Vital Sign    Worried About Running Out of Food in the Last Year: Never true    Ran Out of Food in the Last Year: Not on file  Transportation Needs: No Transportation Needs (08/02/2023)   PRAPARE - Administrator, Civil Service (Medical): No    Lack of Transportation (Non-Medical): No  Physical Activity: Sufficiently Active (08/02/2023)   Exercise Vital Sign    Days of Exercise per Week: 5 days    Minutes of Exercise per Session: 60 min  Stress: No Stress Concern Present (08/02/2023)   [employer] of Occupational Health - Occupational Stress Questionnaire    Feeling of Stress : Only a little  Social Connections: Moderately Integrated (08/02/2023)   Social Connection and Isolation Panel [NHANES]    Frequency of Communication with Friends and Family: More than three times a week    Frequency of Social Gatherings with  Friends and Family: More than three times a week    Attends Religious Services: Never    Database administrator or Organizations: Yes    Attends Engineer, structural: More than 4 times per year    Marital Status: Married  Catering manager Violence: Not on file     Current Outpatient Medications  Medication Instructions   ALPRAZolam  (XANAX ) 0.5 mg, Oral, At bedtime PRN   atorvastatin  (LIPITOR) 20 mg, Oral, Daily at bedtime   cetirizine (ZYRTEC) 10 mg, Oral, Daily PRN   fluocinonide (LIDEX) 0.05 % external solution Topical, 2 times daily PRN   Multiple Vitamin (MULTIVITAMIN WITH MINERALS) TABS tablet 1 tablet, Oral, Daily   tretinoin (RETIN-A) 0.025 % cream 1 application in the evening to face Externally for cosmetic use Once a day   valACYclovir  (VALTREX ) 2,000 mg, Oral, 2 times daily PRN       Objective:    Physical Exam BP 116/66   Pulse (!) 59   Temp 97.8 F (36.6 C) (Oral)   Resp 16   Ht 5\' 9"  (1.753 m)   Wt 159 lb 8 oz (72.3 kg)   SpO2 98%   BMI 23.55 kg/m  General: Well developed, NAD, BMI noted Neck: No  thyromegaly  HEENT:  Normocephalic . Face symmetric, atraumatic Lungs:  CTA B Normal respiratory effort, no intercostal retractions, no accessory muscle use. Heart: RRR,  no murmur.  Abdomen:  Not distended, soft, non-tender. No rebound or rigidity.   Lower extremities: no pretibial edema bilaterally  Skin: Exposed areas without rash. Not pale. Not jaundice Neurologic:  alert & oriented X3.  Speech normal, gait appropriate for age and unassisted Strength symmetric and appropriate for age.  Psych: Cognition and judgment appear intact.  Cooperative with normal attention span and concentration.  Behavior appropriate. No anxious or depressed appearing.     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 Pulmonary nodule: Next CT scan 10-2024  PLAN: Here for CPX -Td 2019 - s/p shingrex 2  - vax I Recommend: PNM 20, flu shot q fall, covid booster (11/2022) --CCS: Colonoscopy 07/2022.  Colonoscopy 09/02/2022, next per GI.    -Prostate cancer screening:asx, check PSA -Lifestyle: Very healthy, goes to the gym almost daily, plays hockey on Sundays. -Labs: CMP FLP CBC A1c PSA  Other issues addressed today: Hyperglycemia: Check A1c High cholesterol: Cardiovascular risk 10-2022 was 8.5%, coronary calcium  score was 42.1, 64th percentile for age. Started atorvastatin  20 mg with good results. Patient would like the least amount of statins.  Rechecking FLP, consider decrease atorvastatin  to 10 mg and add Zetia. Insomnia, anxiety: Xanax , check UDS. History of anemia due to blood donation, no  recent donations, check CBC   RTC 1 year, sooner if needed.

## 2023-08-09 NOTE — Patient Instructions (Addendum)
 Checking your cholesterol Consider decrease Lipitor and start Zetia.  Vaccines I recommend: Pneumonia shot, PNM 20 Flu shot every fall Consider COVID booster if not done by 11/2022   GO TO THE LAB :  Get the blood work   Your results will be posted on MyChart with my comments  Next office visit for a physical exam in 1 year Please make an appointment before you leave today

## 2023-08-10 ENCOUNTER — Encounter: Payer: Self-pay | Admitting: Internal Medicine

## 2023-08-10 NOTE — Assessment & Plan Note (Signed)
 Here for CPX -Td 2019 - s/p shingrex 2  - vax I Recommend: PNM 20, flu shot q fall, covid booster (11/2022) --CCS: Colonoscopy 07/2022.  Colonoscopy 09/02/2022, next per GI.    -Prostate cancer screening:asx, check PSA -Lifestyle: Very healthy, goes to the gym almost daily, plays hockey on Sundays. -Labs: CMP FLP CBC A1c PSA

## 2023-08-10 NOTE — Assessment & Plan Note (Signed)
 Here for CPX   Other issues addressed today: Hyperglycemia: Check A1c High cholesterol: Cardiovascular risk 10-2022 was 8.5%, coronary calcium  score was 42.1, 64th percentile for age. Started atorvastatin  20 mg with good results. Patient would like the least amount of statins.  Rechecking FLP, consider decrease atorvastatin  to 10 mg and add Zetia. Insomnia, anxiety: Xanax , check UDS. History of anemia due to blood donation, no recent donations, check CBC   RTC 1 year, sooner if needed.

## 2023-08-11 ENCOUNTER — Ambulatory Visit: Payer: Self-pay | Admitting: Internal Medicine

## 2023-08-11 DIAGNOSIS — E785 Hyperlipidemia, unspecified: Secondary | ICD-10-CM

## 2023-08-12 LAB — DRUG MONITORING PANEL 375977 , URINE
Alcohol Metabolites: POSITIVE ng/mL — AB (ref <500)
Amphetamines: NEGATIVE ng/mL (ref <500)
Barbiturates: NEGATIVE ng/mL (ref <300)
Benzodiazepines: NEGATIVE ng/mL (ref <100)
Cocaine Metabolite: NEGATIVE ng/mL (ref <150)
Desmethyltramadol: NEGATIVE ng/mL (ref <100)
Ethyl Glucuronide (ETG): 2029 ng/mL — ABNORMAL HIGH (ref <500)
Ethyl Sulfate (ETS): 901 ng/mL — ABNORMAL HIGH (ref <100)
Marijuana Metabolite: NEGATIVE ng/mL (ref <20)
Opiates: NEGATIVE ng/mL (ref <100)
Oxycodone: NEGATIVE ng/mL (ref <100)
Tramadol: NEGATIVE ng/mL (ref <100)

## 2023-08-12 LAB — DM TEMPLATE

## 2023-09-11 ENCOUNTER — Other Ambulatory Visit: Payer: Self-pay | Admitting: Internal Medicine

## 2023-09-11 ENCOUNTER — Other Ambulatory Visit: Payer: Self-pay

## 2023-09-11 ENCOUNTER — Other Ambulatory Visit (HOSPITAL_COMMUNITY): Payer: Self-pay

## 2023-09-11 MED ORDER — VALACYCLOVIR HCL 1 G PO TABS
2000.0000 mg | ORAL_TABLET | Freq: Two times a day (BID) | ORAL | 2 refills | Status: AC | PRN
  Filled 2023-09-11: qty 30, 8d supply, fill #0

## 2023-09-12 ENCOUNTER — Other Ambulatory Visit: Payer: Self-pay

## 2023-11-19 ENCOUNTER — Encounter: Payer: Self-pay | Admitting: Internal Medicine

## 2023-11-23 ENCOUNTER — Other Ambulatory Visit (HOSPITAL_BASED_OUTPATIENT_CLINIC_OR_DEPARTMENT_OTHER): Payer: Self-pay

## 2023-11-23 MED ORDER — COMIRNATY 30 MCG/0.3ML IM SUSY
0.3000 mL | PREFILLED_SYRINGE | Freq: Once | INTRAMUSCULAR | 0 refills | Status: AC
Start: 2023-11-23 — End: 2023-11-24
  Filled 2023-11-23: qty 0.3, 1d supply, fill #0

## 2024-01-23 ENCOUNTER — Other Ambulatory Visit: Payer: Self-pay | Admitting: Internal Medicine

## 2024-01-24 ENCOUNTER — Other Ambulatory Visit (HOSPITAL_COMMUNITY): Payer: Self-pay

## 2024-01-24 ENCOUNTER — Encounter: Payer: Self-pay | Admitting: Pharmacist

## 2024-01-24 ENCOUNTER — Other Ambulatory Visit: Payer: Self-pay

## 2024-01-24 MED ORDER — ALPRAZOLAM 0.5 MG PO TABS
0.5000 mg | ORAL_TABLET | Freq: Every evening | ORAL | 3 refills | Status: AC | PRN
  Filled 2024-01-24: qty 30, 30d supply, fill #0

## 2024-03-26 ENCOUNTER — Other Ambulatory Visit (HOSPITAL_COMMUNITY): Payer: Self-pay

## 2024-03-26 MED ORDER — FLUOROURACIL 5 % EX CREA
1.0000 | TOPICAL_CREAM | Freq: Every day | CUTANEOUS | 0 refills | Status: AC
  Filled 2024-03-26: qty 40, 30d supply, fill #0

## 2024-04-03 ENCOUNTER — Other Ambulatory Visit (HOSPITAL_COMMUNITY): Payer: Self-pay

## 2024-04-23 ENCOUNTER — Ambulatory Visit: Admitting: Internal Medicine

## 2024-08-09 ENCOUNTER — Encounter: Admitting: Internal Medicine
